# Patient Record
Sex: Male | Born: 1965
Health system: Southern US, Community
[De-identification: ages and names within clinical notes are randomized; demographics above are authoritative.]

## PROBLEM LIST (undated history)

## (undated) DIAGNOSIS — T7840XA Allergy, unspecified, initial encounter: Secondary | ICD-10-CM

## (undated) DIAGNOSIS — F419 Anxiety disorder, unspecified: Secondary | ICD-10-CM

## (undated) DIAGNOSIS — S52501A Unspecified fracture of the lower end of right radius, initial encounter for closed fracture: Secondary | ICD-10-CM

## (undated) DIAGNOSIS — H269 Unspecified cataract: Secondary | ICD-10-CM

## (undated) DIAGNOSIS — E785 Hyperlipidemia, unspecified: Secondary | ICD-10-CM

## (undated) DIAGNOSIS — J309 Allergic rhinitis, unspecified: Secondary | ICD-10-CM

## (undated) DIAGNOSIS — M1990 Unspecified osteoarthritis, unspecified site: Secondary | ICD-10-CM

## (undated) DIAGNOSIS — J45909 Unspecified asthma, uncomplicated: Secondary | ICD-10-CM

## (undated) HISTORY — DX: Unspecified cataract: H26.9

## (undated) HISTORY — DX: Allergic rhinitis, unspecified: J30.9

## (undated) HISTORY — DX: Unspecified asthma, uncomplicated: J45.909

## (undated) HISTORY — DX: Hyperlipidemia, unspecified: E78.5

## (undated) HISTORY — PX: FRACTURE SURGERY: SHX138

## (undated) HISTORY — DX: Allergy, unspecified, initial encounter: T78.40XA

## (undated) HISTORY — DX: Unspecified osteoarthritis, unspecified site: M19.90

## (undated) HISTORY — DX: Anxiety disorder, unspecified: F41.9

## (undated) HISTORY — PX: TONSILLECTOMY AND ADENOIDECTOMY: SHX28

## (undated) HISTORY — PX: GANGLION CYST EXCISION: SHX1691

---

## 1999-09-08 ENCOUNTER — Ambulatory Visit (HOSPITAL_BASED_OUTPATIENT_CLINIC_OR_DEPARTMENT_OTHER): Admission: RE | Admit: 1999-09-08 | Discharge: 1999-09-08 | Payer: Self-pay | Admitting: Orthopedic Surgery

## 2011-09-06 DIAGNOSIS — Z0000 Encounter for general adult medical examination without abnormal findings: Secondary | ICD-10-CM | POA: Insufficient documentation

## 2012-03-17 ENCOUNTER — Encounter: Payer: Self-pay | Admitting: Family Medicine

## 2012-04-28 ENCOUNTER — Ambulatory Visit (INDEPENDENT_AMBULATORY_CARE_PROVIDER_SITE_OTHER): Payer: BC Managed Care – PPO | Admitting: Family Medicine

## 2012-04-28 ENCOUNTER — Encounter: Payer: Self-pay | Admitting: Family Medicine

## 2012-04-28 VITALS — BP 126/72 | HR 76 | Temp 98.4°F | Resp 16 | Ht 72.5 in | Wt 189.4 lb

## 2012-04-28 DIAGNOSIS — J309 Allergic rhinitis, unspecified: Secondary | ICD-10-CM

## 2012-04-28 DIAGNOSIS — J301 Allergic rhinitis due to pollen: Secondary | ICD-10-CM

## 2012-04-28 DIAGNOSIS — R319 Hematuria, unspecified: Secondary | ICD-10-CM

## 2012-04-28 DIAGNOSIS — Z23 Encounter for immunization: Secondary | ICD-10-CM

## 2012-04-28 DIAGNOSIS — M79673 Pain in unspecified foot: Secondary | ICD-10-CM

## 2012-04-28 DIAGNOSIS — Z0000 Encounter for general adult medical examination without abnormal findings: Secondary | ICD-10-CM

## 2012-04-28 DIAGNOSIS — Z7251 High risk heterosexual behavior: Secondary | ICD-10-CM

## 2012-04-28 DIAGNOSIS — M79609 Pain in unspecified limb: Secondary | ICD-10-CM

## 2012-04-28 HISTORY — DX: Allergic rhinitis, unspecified: J30.9

## 2012-04-28 LAB — POCT URINALYSIS DIPSTICK
Bilirubin, UA: NEGATIVE
Blood, UA: NEGATIVE
Ketones, UA: NEGATIVE
Protein, UA: NEGATIVE
pH, UA: 7

## 2012-04-28 LAB — POCT UA - MICROSCOPIC ONLY
Crystals, Ur, HPF, POC: NEGATIVE
Mucus, UA: NEGATIVE
WBC, Ur, HPF, POC: NEGATIVE
Yeast, UA: NEGATIVE

## 2012-04-28 MED ORDER — SILDENAFIL CITRATE 100 MG PO TABS
50.0000 mg | ORAL_TABLET | Freq: Every day | ORAL | Status: DC | PRN
Start: 2012-04-28 — End: 2012-09-28

## 2012-04-28 MED ORDER — BUDESONIDE 32 MCG/ACT NA SUSP: 1.0000 | Freq: Every day | NASAL | Status: DC

## 2012-04-28 NOTE — Progress Notes (Signed)
Subjective:    Patient ID: Kyle Swanson, male    DOB: [birth year], 47 y.o.   MRN: [id number]  HPI Kyle Swanson is a 47 y.o. male Here for annual exam Last tetanus: td in 10/05.  Flu vaccine: given today.   No new medical problems. Feeling ok overall. Less exercise past year - no regular exercise.  Etoh: 3-5 beers,  3-4 times per week, but not everyday, and not every week.  No hx of personal, family or work problems or problems or addiction, no DUI.   Few weeks of nasal congestion - helps with allegra.  Prior on nasal spray. Would like to try rhinocort again if not helping with antihistamine.    Rare soreness in base of feet when waking up only - resolves with walking.   No recent dark or tarry stools, no rectal bleeding.   Paper chart reviewed, last CPE 02/05/11. Trace microscopic hematuria, but normal PSA. Reported dark stools episodically, but negative hemosure. Vitamin D, lipids, PSA, CMP, CBC WNL then. EKG WNL 2010.   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 4-5 new partners since last HIV test. Condoms sometimes. Amenable to sti testing.   Prior trouble maintaining erection - treated with Levitra.  No se's.  Occasional difficulty with this. No recent testosterone level.    No family history on file. Past Medical History  Diagnosis Date  . Allergy   . Asthma    History   Social History  . Marital Status: Single    Spouse Name: N/A    Number of Children: N/A  . Years of Education: N/A   Occupational History  . Not on file.   Social History Main Topics  . Smoking status: Never Smoker   . Smokeless tobacco: Not on file  . Alcohol Use: Yes     Comment: BEER  . Drug Use: No  . Sexually Active: Not on file   Other Topics Concern  . Not on file   Social History Narrative  . No narrative on file    Review of Systems  All other systems reviewed and are negative.   CMA note reviewed.     Objective:   Physical Exam  Vitals reviewed. Constitutional: He is oriented to  person, place, and time. He appears well-developed and well-nourished.  HENT:  Head: Normocephalic and atraumatic.  Right Ear: External ear normal.  Left Ear: External ear normal.  Mouth/Throat: Oropharynx is clear and moist.  Eyes: Conjunctivae normal and EOM are normal. Pupils are equal, round, and reactive to light.  Neck: Normal range of motion. Neck supple. No thyromegaly present.  Cardiovascular: Normal rate, regular rhythm, normal heart sounds and intact distal pulses.   Pulmonary/Chest: Effort normal and breath sounds normal. No respiratory distress. He has no wheezes.  Abdominal: Soft. He exhibits no distension. There is no tenderness. Hernia confirmed negative in the right inguinal area and confirmed negative in the left inguinal area.  Musculoskeletal: Normal range of motion. He exhibits no edema and no tenderness.  Lymphadenopathy:    He has no cervical adenopathy.  Neurological: He is alert and oriented to person, place, and time. He has normal reflexes.  Skin: Skin is warm and dry.  Psychiatric: He has a normal mood and affect. His behavior is normal.   Weight increased 10 pounds form 02/05/11 ov.    Beck depression scale 4.  Results for orders placed in visit on 04/28/12  POCT UA - MICROSCOPIC ONLY      Component Value Range  WBC, Ur, HPF, POC neg     RBC, urine, microscopic 0-1     Bacteria, U Microscopic neg     Mucus, UA neg     Epithelial cells, urine per micros 0-1     Crystals, Ur, HPF, POC neg     Casts, Ur, LPF, POC neg     Yeast, UA neg    POCT URINALYSIS DIPSTICK      Component Value Range   Color, UA yellow     Clarity, UA clear     Glucose, UA neg     Bilirubin, UA neg     Ketones, UA neg     Spec Grav, UA 1.020     Blood, UA neg     pH, UA 7.0     Protein, UA neg     Urobilinogen, UA 0.2     Nitrite, UA neg     Leukocytes, UA Negative         Assessment & Plan:  Kyle Swanson is a 47 y.o. male 1. Problems related to high-risk sexual  behavior  Hepatitis B surface antibody, Hepatitis B surface antigen, Hepatitis C antibody, HSV(herpes simplex vrs) 1+2 ab-IgG, HIV antibody, RPR, GC/chlamydia probe amp, urine, Testosterone, sildenafil (VIAGRA) 100 MG tablet  2. Routine general medical examination at a health care facility  CBC with Differential, PSA, Comprehensive metabolic panel, Lipid panel, TSH, POCT UA - Microscopic Only, POCT urinalysis dipstick  3. Hematuria  POCT UA - Microscopic Only. Check PSA - if elevated, or urinary sx's - rtc for recheck.   4. Need for prophylactic vaccination and inoculation against influenza  Flu vaccine greater than or equal to 3yo preservative free IM given.   5. Allergic rhinitis  budesonide (RHINOCORT AQUA) 32 MCG/ACT nasal spray if not improved with allegra or zyrtec otc.   6. Foot pain  Possible PF based on hx.  Discussed stretches, recheck in next 6 weeks if not improving.   7. Need for Tdap vaccination  Tdap vaccine greater than or equal to 7yo IM given.    PE - anticipatory guidance. Labs as above.  Flu vaccine given today. Tdap given.  Patient Instructions  Your should receive a call or letter about your lab results within the next week to 10 days.   Stretches for foot pains - if this persists - recheck in office to discuss. No blood seen on the initial urine test, possible rare red blood cell on the microscopic test - will check prostate test as discussed - if any new urinary symptoms - return for discussion/workup. Can take 1/2 of viagra as needed - recheck if not improving.  Allegra or zyrtec of allergies, rhinocort if needed, recheck if not improving.  Return to the clinic or go to the nearest emergency room if any of your symptoms worsen or new symptoms occur.   Keeping you healthy  Get these tests  Blood pressure- Have your blood pressure checked once a year by your healthcare provider.  Normal blood pressure is 120/80.  Weight- Have your body mass index (BMI) calculated to  screen for obesity.  BMI is a measure of body fat based on height and weight. You can also calculate your own BMI at https://www.west-esparza.com/.  Cholesterol- Have your cholesterol checked regularly starting at age 31, sooner may be necessary if you have diabetes, high blood pressure, if a family member developed heart diseases at an early age or if you smoke.   Chlamydia, HIV, and other sexual transmitted  disease- Get screened each year until the age of 70 then within three months of each new sexual partner.  Diabetes- Have your blood sugar checked regularly if you have high blood pressure, high cholesterol, a family history of diabetes or if you are overweight.  Get these vaccines  Flu shot- Every fall.  Tetanus shot- Every 10 years.  Menactra- Single dose; prevents meningitis.  Take these steps  Don't smoke- If you do smoke, ask your healthcare provider about quitting. For tips on how to quit, go to www.smokefree.gov or call 1-800-QUIT-NOW.  Be physically active- Exercise 5 days a week for at least 30 minutes.  If you are not already physically active start slow and gradually work up to 30 minutes of moderate physical activity.  Examples of moderate activity include walking briskly, mowing the yard, dancing, swimming bicycling, etc.  Eat a healthy diet- Eat a variety of healthy foods such as fruits, vegetables, low fat milk, low fat cheese, yogurt, lean meats, poultry, fish, beans, tofu, etc.  For more information on healthy eating, go to www.thenutritionsource.org  Drink alcohol in moderation- Limit alcohol intake two drinks or less a day.  Never drink and drive.  Dentist- Brush and floss teeth twice daily; visit your dentis twice a year.  Depression-Your emotional health is as important as your physical health.  If you're feeling down, losing interest in things you normally enjoy please talk with your healthcare provider.  Gun Safety- If you keep a gun in your home, keep it unloaded  and with the safety lock on.  Bullets should be stored separately.  Helmet use- Always wear a helmet when riding a motorcycle, bicycle, rollerblading or skateboarding.  Safe sex- If you may be exposed to a sexually transmitted infection, use a condom  Seat belts- Seat bels can save your life; always wear one.  Smoke/Carbon Monoxide detectors- These detectors need to be installed on the appropriate level of your home.  Replace batteries at least once a year.  Skin Cancer- When out in the sun, cover up and use sunscreen SPF 15 or higher.  Violence- If anyone is threatening or hurting you, please tell your healthcare provider.

## 2012-04-28 NOTE — Progress Notes (Signed)
  Subjective:    Patient ID: Kyle Swanson, male    DOB: 05-Oct-1965, 47 y.o.   MRN: [id number]  HPI    Review of Systems  Constitutional: Negative.   HENT: Positive for congestion, sneezing, neck pain and sinus pressure.   Eyes: Negative.   Respiratory: Negative.   Cardiovascular: Negative.   Gastrointestinal: Negative.   Genitourinary: Negative.   Skin: Negative.   Neurological: Negative.   Hematological: Negative.   Psychiatric/Behavioral: Negative.        Objective:   Physical Exam        Assessment & Plan:

## 2012-04-28 NOTE — Patient Instructions (Addendum)
Your should receive a call or letter about your lab results within the next week to 10 days.   Stretches for foot pains - if this persists - recheck in office to discuss. No blood seen on the initial urine test, possible rare red blood cell on the microscopic test - will check prostate test as discussed - if any new urinary symptoms - return for discussion/workup. Can take 1/2 of viagra as needed - recheck if not improving.  Allegra or zyrtec of allergies, rhinocort if needed, recheck if not improving.  Return to the clinic or go to the nearest emergency room if any of your symptoms worsen or new symptoms occur.   Keeping you healthy  Get these tests  Blood pressure- Have your blood pressure checked once a year by your healthcare provider.  Normal blood pressure is 120/80.  Weight- Have your body mass index (BMI) calculated to screen for obesity.  BMI is a measure of body fat based on height and weight. You can also calculate your own BMI at https://www.west-esparza.com/.  Cholesterol- Have your cholesterol checked regularly starting at age 52, sooner may be necessary if you have diabetes, high blood pressure, if a family member developed heart diseases at an early age or if you smoke.   Chlamydia, HIV, and other sexual transmitted disease- Get screened each year until the age of 62 then within three months of each new sexual partner.  Diabetes- Have your blood sugar checked regularly if you have high blood pressure, high cholesterol, a family history of diabetes or if you are overweight.  Get these vaccines  Flu shot- Every fall.  Tetanus shot- Every 10 years.  Menactra- Single dose; prevents meningitis.  Take these steps  Don't smoke- If you do smoke, ask your healthcare provider about quitting. For tips on how to quit, go to www.smokefree.gov or call 1-800-QUIT-NOW.  Be physically active- Exercise 5 days a week for at least 30 minutes.  If you are not already physically active start  slow and gradually work up to 30 minutes of moderate physical activity.  Examples of moderate activity include walking briskly, mowing the yard, dancing, swimming bicycling, etc.  Eat a healthy diet- Eat a variety of healthy foods such as fruits, vegetables, low fat milk, low fat cheese, yogurt, lean meats, poultry, fish, beans, tofu, etc.  For more information on healthy eating, go to www.thenutritionsource.org  Drink alcohol in moderation- Limit alcohol intake two drinks or less a day.  Never drink and drive.  Dentist- Brush and floss teeth twice daily; visit your dentis twice a year.  Depression-Your emotional health is as important as your physical health.  If you're feeling down, losing interest in things you normally enjoy please talk with your healthcare provider.  Gun Safety- If you keep a gun in your home, keep it unloaded and with the safety lock on.  Bullets should be stored separately.  Helmet use- Always wear a helmet when riding a motorcycle, bicycle, rollerblading or skateboarding.  Safe sex- If you may be exposed to a sexually transmitted infection, use a condom  Seat belts- Seat bels can save your life; always wear one.  Smoke/Carbon Monoxide detectors- These detectors need to be installed on the appropriate level of your home.  Replace batteries at least once a year.  Skin Cancer- When out in the sun, cover up and use sunscreen SPF 15 or higher.  Violence- If anyone is threatening or hurting you, please tell your healthcare provider.

## 2012-04-29 ENCOUNTER — Encounter: Payer: Self-pay | Admitting: *Deleted

## 2012-04-29 LAB — HEPATITIS C ANTIBODY: HCV Ab: NEGATIVE

## 2012-04-29 LAB — CBC WITH DIFFERENTIAL/PLATELET
Basophils Absolute: 0 10*3/uL (ref 0.0–0.1)
Eosinophils Relative: 7 % — ABNORMAL HIGH (ref 0–5)
Lymphocytes Relative: 27 % (ref 12–46)
Lymphs Abs: 1.5 10*3/uL (ref 0.7–4.0)
MCV: 87 fL (ref 78.0–100.0)
Neutro Abs: 3.3 10*3/uL (ref 1.7–7.7)
Neutrophils Relative %: 56 % (ref 43–77)
Platelets: 219 10*3/uL (ref 150–400)
RBC: 5.24 MIL/uL (ref 4.22–5.81)
RDW: 13.7 % (ref 11.5–15.5)
WBC: 5.7 10*3/uL (ref 4.0–10.5)

## 2012-04-29 LAB — HSV(HERPES SIMPLEX VRS) I + II AB-IGG
HSV 1 Glycoprotein G Ab, IgG: <0.1 IV
HSV 2 Glycoprotein G Ab, IgG: 0.25 IV

## 2012-04-29 LAB — COMPREHENSIVE METABOLIC PANEL
ALT: 35 U/L (ref 0–53)
AST: 22 U/L (ref 0–37)
Albumin: 4.3 g/dL (ref 3.5–5.2)
Alkaline Phosphatase: 55 U/L (ref 39–117)
BUN: 21 mg/dL (ref 6–23)
Calcium: 9.1 mg/dL (ref 8.4–10.5)
Chloride: 106 mEq/L (ref 96–112)
Potassium: 3.8 mEq/L (ref 3.5–5.3)
Sodium: 141 mEq/L (ref 135–145)
Total Protein: 6.5 g/dL (ref 6.0–8.3)

## 2012-04-29 LAB — HEPATITIS B SURFACE ANTIBODY, QUANTITATIVE: Hepatitis B-Post: >1000 m[IU]/mL

## 2012-04-29 LAB — LIPID PANEL
HDL: 60 mg/dL (ref >39)
LDL Cholesterol: 94 mg/dL (ref 0–99)

## 2012-04-29 LAB — TESTOSTERONE: Testosterone: 432 ng/dL (ref 300–890)

## 2012-04-29 LAB — TSH: TSH: 1.972 u[IU]/mL (ref 0.350–4.500)

## 2012-04-29 LAB — PSA: PSA: 1.11 ng/mL (ref <=4.00)

## 2012-05-01 ENCOUNTER — Telehealth: Payer: Self-pay

## 2012-05-01 NOTE — Telephone Encounter (Signed)
Pt CB to get his lab results. I gave him all normal/neg results and mailed a copy to pt.

## 2012-09-28 ENCOUNTER — Encounter (HOSPITAL_COMMUNITY): Payer: Self-pay | Admitting: Emergency Medicine

## 2012-09-28 ENCOUNTER — Emergency Department (HOSPITAL_COMMUNITY): Payer: BC Managed Care – PPO

## 2012-09-28 ENCOUNTER — Emergency Department (HOSPITAL_COMMUNITY)
Admission: EM | Admit: 2012-09-28 | Discharge: 2012-09-28 | Disposition: A | Discharge status: Home / Self Care | Payer: BC Managed Care – PPO | Attending: Emergency Medicine | Admitting: Emergency Medicine

## 2012-09-28 DIAGNOSIS — Z79899 Other long term (current) drug therapy: Secondary | ICD-10-CM | POA: Insufficient documentation

## 2012-09-28 DIAGNOSIS — J45909 Unspecified asthma, uncomplicated: Secondary | ICD-10-CM | POA: Insufficient documentation

## 2012-09-28 DIAGNOSIS — R002 Palpitations: Secondary | ICD-10-CM | POA: Insufficient documentation

## 2012-09-28 LAB — CBC
HCT: 44.7 % (ref 39.0–52.0)
Hemoglobin: 15.7 g/dL (ref 13.0–17.0)
MCH: 31.5 pg (ref 26.0–34.0)
MCHC: 35.1 g/dL (ref 30.0–36.0)
RBC: 4.98 MIL/uL (ref 4.22–5.81)

## 2012-09-28 LAB — URINALYSIS, ROUTINE W REFLEX MICROSCOPIC
Glucose, UA: NEGATIVE mg/dL
Hgb urine dipstick: NEGATIVE
Ketones, ur: NEGATIVE mg/dL
Leukocytes, UA: NEGATIVE
pH: 7 (ref 5.0–8.0)

## 2012-09-28 LAB — POCT I-STAT, CHEM 8
BUN: 18 mg/dL (ref 6–23)
Chloride: 102 mEq/L (ref 96–112)
Creatinine, Ser: 1.2 mg/dL (ref 0.50–1.35)
Sodium: 139 mEq/L (ref 135–145)

## 2012-09-28 LAB — POCT I-STAT TROPONIN I: Troponin i, poc: 0 ng/mL (ref 0.00–0.08)

## 2012-09-28 NOTE — ED Notes (Addendum)
PT. IS IN THE RESTROOM .

## 2012-09-28 NOTE — ED Provider Notes (Signed)
History    CSN: [id number] Arrival date & time 09/28/12  0113  First MD Initiated Contact with Patient 09/28/12 0130     Chief Complaint  Patient presents with  . Palpitations   (Consider location/radiation/quality/duration/timing/severity/associated sxs/prior Treatment) HPI Comments: 47 year old male with no past medical history who takes no prescription medications and presents with several days of feeling of palpitations. He states that approximately one week ago he was taking and over-the-counter cough medication which he thinks was guaifenesin and has been taking this intermittently until yesterday when he stopped. He now feels like he has occasional palpitations, he feels like this is a vibrating sensation around his chest on the left, last one to 2 seconds and then resolves. This may happen multiple times a minute, sometimes it goes for several hours without this feeling. He denies any associated fevers, chills, nausea, vomiting, chest pain, shortness of breath, swelling in the legs. He has had very little energy all week long he is currently asymptomatic. Nothing seems to make the symptoms come on. He denies any significant alcohol consumption drinking occasional beer. He denies any tobacco use, no significant caffeine use other than 3 diet sodas a day which is no change from his prior use.  Patient is a 47 y.o. male presenting with palpitations. The history is provided by the patient and the spouse.  Palpitations  Past Medical History  Diagnosis Date  . Allergy   . Asthma    Past Surgical History  Procedure Laterality Date  . Tonsillectomy and adenoidectomy      childhood  . Ganglion cyst excision      left wrist 10 years ago   Family History  Problem Relation Age of Onset  . COPD Father     emphysema  . Cancer Maternal Grandmother     skin  . Cancer Paternal Grandmother   . Diabetes Paternal Grandfather    History  Substance Use Topics  . Smoking status: Never  Smoker   . Smokeless tobacco: Not on file  . Alcohol Use: Yes     Comment: BEER    Review of Systems  Cardiovascular: Positive for palpitations.  All other systems reviewed and are negative.    Allergies  Sulfa antibiotics  Home Medications   Current Outpatient Rx  Name  Route  Sig  Dispense  Refill  . b complex vitamins tablet   Oral   Take 1 tablet by mouth daily.         . budesonide (RHINOCORT AQUA) 32 MCG/ACT nasal spray   Nasal   Place 1 spray into the nose daily.   1 Bottle   3   . Fexofenadine HCl (ALLEGRA PO)   Oral   Take by mouth as needed.         . sildenafil (VIAGRA) 100 MG tablet   Oral   Take 0.5 tablets (50 mg total) by mouth daily as needed for erectile dysfunction.   6 tablet   0   . VITAMIN D, CHOLECALCIFEROL, PO   Oral   Take by mouth daily.          BP 175/98  Pulse 81  Temp(Src) 98 F (36.7 C) (Oral)  Resp 18  SpO2 99% Physical Exam  Nursing note and vitals reviewed. Constitutional: He appears well-developed and well-nourished. No distress.  HENT:  Head: Normocephalic and atraumatic.  Mouth/Throat: Oropharynx is clear and moist. No oropharyngeal exudate.  Eyes: Conjunctivae and EOM are normal. Pupils are equal, round, and reactive  to light. Right eye exhibits no discharge. Left eye exhibits no discharge. No scleral icterus.  Neck: Normal range of motion. Neck supple. No JVD present. No thyromegaly present.  Cardiovascular: Normal rate, regular rhythm, normal heart sounds and intact distal pulses.  Exam reveals no gallop and no friction rub.   No murmur heard. Pulmonary/Chest: Effort normal and breath sounds normal. No respiratory distress. He has no wheezes. He has no rales.  Abdominal: Soft. Bowel sounds are normal. He exhibits no distension and no mass. There is no tenderness.  Musculoskeletal: Normal range of motion. He exhibits no edema and no tenderness.  Lymphadenopathy:    He has no cervical adenopathy.   Neurological: He is alert. Coordination normal.  Skin: Skin is warm and dry. No rash noted. No erythema.  Psychiatric: He has a normal mood and affect. His behavior is normal.    ED Course  Procedures (including critical care time) Labs Reviewed  URINALYSIS, ROUTINE W REFLEX MICROSCOPIC - Abnormal; Notable for the following:    Color, Urine STRAW (*)    All other components within normal limits  CBC  TSH  POCT I-STAT, CHEM 8  POCT I-STAT TROPONIN I   Dg Chest Port 1 View  09/28/2012   *RADIOLOGY REPORT*  Clinical Data: Chest pain, heart flutter, nausea, right hand numbness, history asthma  PORTABLE CHEST - 1 VIEW  Comparison: Portable exam 0156 hours without priors for comparison  Findings: Normal heart size, mediastinal contours, and pulmonary vascularity. Lungs clear. No pleural effusion or pneumothorax. Bones unremarkable.  IMPRESSION: No acute abnormalities.   Original Report Authenticated By: Ulyses Southward, M.D.   1. Palpitations     MDM  The patient's exam is very normal, his EKG is very normal, he has been using an over-the-counter medication which may account for these intermittent palpitations however when I examined the patient and he tells me that he is having a palpitation there is no corresponding arrhythmia or ectopic beats on the monitor. We'll check his electrolytes and a urinalysis as he also notes that he is been having urinary frequency. The patient is hemodynamically stable and has mild hypertension on his initial set of vital signs.  ED ECG REPORT  I personally interpreted this EKG   Date: 09/28/2012   Rate: 83  Rhythm: normal sinus rhythm  QRS Axis: normal  Intervals: normal  ST/T Wave abnormalities: normal  Conduction Disutrbances:none  Narrative Interpretation:   Old EKG Reviewed: none available  Labs are all normal - chest xray clear - pt stable for d/c.  Vida Roller, MD 09/28/12 [phone]

## 2012-09-28 NOTE — ED Notes (Signed)
PT. REPORTS PALPITATIONS " FEELS LIKE IT'S VIBRATING" ONSET YESTERDAY , DENIES CHEST PAIN OR SOB , SLIGHT NAUSEA / NO DIAPHORESSIS.

## 2012-09-30 ENCOUNTER — Telehealth (HOSPITAL_COMMUNITY): Payer: Self-pay | Admitting: Emergency Medicine

## 2012-09-30 NOTE — ED Notes (Signed)
Pt calling for lab results(unable to get into My Chart).  ID verified x 2.  Informed TSH WNL.

## 2012-10-06 ENCOUNTER — Ambulatory Visit (INDEPENDENT_AMBULATORY_CARE_PROVIDER_SITE_OTHER): Payer: BC Managed Care – PPO | Admitting: Family Medicine

## 2012-10-06 ENCOUNTER — Encounter: Payer: Self-pay | Admitting: Family Medicine

## 2012-10-06 VITALS — BP 123/72 | HR 79 | Temp 98.6°F | Resp 16 | Ht 73.0 in | Wt 188.2 lb

## 2012-10-06 DIAGNOSIS — F4320 Adjustment disorder, unspecified: Secondary | ICD-10-CM

## 2012-10-06 DIAGNOSIS — R002 Palpitations: Secondary | ICD-10-CM

## 2012-10-06 DIAGNOSIS — R351 Nocturia: Secondary | ICD-10-CM

## 2012-10-06 DIAGNOSIS — R350 Frequency of micturition: Secondary | ICD-10-CM

## 2012-10-06 LAB — POCT URINALYSIS DIPSTICK
Bilirubin, UA: NEGATIVE
Glucose, UA: NEGATIVE
Leukocytes, UA: NEGATIVE
Nitrite, UA: NEGATIVE
Urobilinogen, UA: 0.2

## 2012-10-06 LAB — POCT UA - MICROSCOPIC ONLY
Crystals, Ur, HPF, POC: NEGATIVE
WBC, Ur, HPF, POC: NEGATIVE

## 2012-10-06 MED ORDER — ALPRAZOLAM 0.25 MG PO TABS: 0.2500 mg | ORAL_TABLET | Freq: Two times a day (BID) | ORAL | Status: DC | PRN

## 2012-10-06 NOTE — Progress Notes (Signed)
Subjective:    Patient ID: Kyle Swanson, male    DOB: 1965-11-25, 47 y.o.   MRN: [id number]  HPI Kyle Swanson is a 47 y.o. male Seen in ER 09/28/12 with palpitations, fatigue. was taking otc cold med prior., 3 diet sodas per day by hx in ER.  Had EKG, CXR and labs including lytes and TSH that were reassuring and noted EDP note that patient had palpitiation while he was there and no ectopy on monitor.   Here for follow up.   Feels "mini vibrations in chest" - started on 09/27/12.  Last 1-2 seconds, sometimes up to 5-6 in a row.  Had increased in frequency by the time he went to the ER, and trouble getting to sleep,and nocturia- 2-3 times per night. (U/a was normal in ER).  Also had numbness in R hand that night - noticed occasionally in the past depending on how he sleeps.   Had been doing better past few days, no palpitation sx's - then 2 or 3 today. No chest pain/heaviness.   Less sodas - now 1-2/day. Less alcohol - 3 beers in past week.  No dysuria, but persistent nocturia - 2-3 times per night.  If anxious or stressed during day - will feel need to urinate. NO personal or FH of MI/CAD.   Stressors: brother moving to [place], short staffed at work, going through [employer]. Unsure if will have a job.  Does not feel depressed, but stressed with [employer].   CXR report in ER: *RADIOLOGY REPORT*  Clinical Data: Chest pain, heart flutter, nausea, right hand  numbness, history asthma  PORTABLE CHEST - 1 VIEW  Comparison: Portable exam 0156 hours without priors for comparison  Findings:  Normal heart size, mediastinal contours, and pulmonary vascularity.  Lungs clear.  No pleural effusion or pneumothorax.  Bones unremarkable.  IMPRESSION:  No acute abnormalities.    Results for orders placed during the hospital encounter of 09/28/12  TSH      Result Value Range   TSH 2.730  0.350 - 4.500 uIU/mL  CBC      Result Value Range   WBC 6.7  4.0 - 10.5 K/uL   RBC 4.98  4.22 - 5.81 MIL/uL   Hemoglobin 15.7  13.0 - 17.0 g/dL   HCT 40.9  81.1 - 91.4 %   MCV 89.8  78.0 - 100.0 fL   MCH 31.5  26.0 - 34.0 pg   MCHC 35.1  30.0 - 36.0 g/dL   RDW 78.2  95.6 - 21.3 %   Platelets 198  150 - 400 K/uL  URINALYSIS, ROUTINE W REFLEX MICROSCOPIC      Result Value Range   Color, Urine STRAW (*) YELLOW   APPearance CLEAR  CLEAR   Specific Gravity, Urine 1.005  1.005 - 1.030   pH 7.0  5.0 - 8.0   Glucose, UA NEGATIVE  NEGATIVE mg/dL   Hgb urine dipstick NEGATIVE  NEGATIVE   Bilirubin Urine NEGATIVE  NEGATIVE   Ketones, ur NEGATIVE  NEGATIVE mg/dL   Protein, ur NEGATIVE  NEGATIVE mg/dL   Urobilinogen, UA 0.2  0.0 - 1.0 mg/dL   Nitrite NEGATIVE  NEGATIVE   Leukocytes, UA NEGATIVE  NEGATIVE  POCT I-STAT, CHEM 8      Result Value Range   Sodium 139  135 - 145 mEq/L   Potassium 4.3  3.5 - 5.1 mEq/L   Chloride 102  96 - 112 mEq/L   BUN 18  6 - 23 mg/dL  Creatinine, Ser 1.20  0.50 - 1.35 mg/dL   Glucose, Bld 95  70 - 99 mg/dL   Calcium, Ion 9.60  4.54 - 1.23 mmol/L   TCO2 28  0 - 100 mmol/L   Hemoglobin 15.6  13.0 - 17.0 g/dL   HCT 09.8  11.9 - 14.7 %  POCT I-STAT TROPONIN I      Result Value Range   Troponin i, poc 0.00  0.00 - 0.08 ng/mL   Comment 3              Review of Systems  Constitutional: Positive for fatigue (prior - but improved now. ). Negative for fever, chills and unexpected weight change.  Eyes: Negative for visual disturbance.  Respiratory: Negative for cough, chest tightness and shortness of breath.   Cardiovascular: Negative for chest pain, palpitations and leg swelling.  Gastrointestinal: Negative for abdominal pain and blood in stool.  Genitourinary: Negative for dysuria, urgency, frequency, hematuria, decreased urine volume, discharge, penile swelling, scrotal swelling, difficulty urinating (2-3 times per night, but usually ok during the day. ) and testicular pain.  Neurological: Negative for dizziness, light-headedness and headaches.       Objective:    Physical Exam  Vitals reviewed. Constitutional: He is oriented to person, place, and time. He appears well-developed and well-nourished.  HENT:  Head: Normocephalic and atraumatic.  Eyes: EOM are normal. Pupils are equal, round, and reactive to light.  Neck: No JVD present. Carotid bruit is not present.  Cardiovascular: Normal rate, regular rhythm and normal heart sounds.   No murmur heard. Pulmonary/Chest: Effort normal and breath sounds normal. He has no rales.  Abdominal: Soft. Bowel sounds are normal. There is no tenderness.  Genitourinary: Prostate normal. Prostate is not tender.  Musculoskeletal: He exhibits no edema.  Neurological: He is alert and oriented to person, place, and time.  Skin: Skin is warm and dry.  Psychiatric: He has a normal mood and affect. His behavior is normal. Thought content normal.   EKG: sr, no acute findings, no ectopy.   Results for orders placed in visit on 10/06/12  POCT URINALYSIS DIPSTICK      Result Value Range   Color, UA yellow     Clarity, UA clear     Glucose, UA neg     Bilirubin, UA neg     Ketones, UA neg     Spec Grav, UA 1.015     Blood, UA trace     pH, UA 7.5     Protein, UA neg     Urobilinogen, UA 0.2     Nitrite, UA neg     Leukocytes, UA Negative    POCT UA - MICROSCOPIC ONLY      Result Value Range   WBC, Ur, HPF, POC neg     RBC, urine, microscopic 0-3     Bacteria, U Microscopic neg     Mucus, UA neg     Epithelial cells, urine per micros 0-1     Crystals, Ur, HPF, POC neg     Casts, Ur, LPF, POC neg     Yeast, UA neg         Assessment & Plan:  Kyle Swanson is a 47 y.o. male Palpitations - Plan: EKG 12-Lead, POCT urinalysis dipstick, ALPRAZolam (XANAX) 0.25 MG tablet  Nocturia - Plan: POCT UA - Microscopic Only, PSA  Urinary frequency - Plan: POCT UA - Microscopic Only, PSA  Adjustment disorder - Plan: ALPRAZolam (XANAX) 0.25 MG tablet  Palpitations - reassuring ER workup, EKG, and labs. Low risk for  CAD with only RF age 24.  Suspect stress/anxiety/adjustment component with recent stressors and possible caffeine component. Trial of xanax only if needed with stressful times, but also relaxation techniques/stress mgt techniques.  If not improving this week , consider cards eval for possible holter monitoring.  Continue to limit caffeine intake.   Urinary frequency - check PSA, limit late night fluids. Few rbc on U/a, but doubt infection. rechcek u/a at next ov in few weeks. Sooner if worse.   Meds ordered this encounter  Medications  . ALPRAZolam (XANAX) 0.25 MG tablet    Sig: Take 1 tablet (0.25 mg total) by mouth 2 (two) times daily as needed for anxiety.    Dispense:  20 tablet    Refill:  0     Patient Instructions  Your labs look ok from the ER and today. Will check prostate test, but can try to decrease fluid intake within 2 hours of bedtime.  Stay hydrated the rest of the day. If palpitations/chest feeling is not improving with relaxation techniques and xanax as needed - let me know and I will refer you to cardiology.  Return to the clinic or go to the nearest emergency room if any of your symptoms worsen or new symptoms occur.  If urinary symptoms not improving in next 1-2 weeks - recheck to discuss further.

## 2012-10-06 NOTE — Patient Instructions (Addendum)
Your labs look ok from the ER and today. Will check prostate test, but can try to decrease fluid intake within 2 hours of bedtime.  Stay hydrated the rest of the day. If palpitations/chest feeling is not improving with relaxation techniques and xanax as needed - let me know and I will refer you to cardiology.  Return to the clinic or go to the nearest emergency room if any of your symptoms worsen or new symptoms occur.  If urinary symptoms not improving in next 1-2 weeks - recheck to discuss further.

## 2012-10-07 LAB — PSA: PSA: 1.16 ng/mL (ref <=4.00)

## 2012-10-17 ENCOUNTER — Telehealth: Payer: Self-pay

## 2012-10-17 DIAGNOSIS — R002 Palpitations: Secondary | ICD-10-CM

## 2012-10-17 DIAGNOSIS — R5381 Other malaise: Secondary | ICD-10-CM

## 2012-10-17 NOTE — Telephone Encounter (Signed)
To Dr Neva Seat also, FYI patient called back for referral, it is made for him, per your office visit.

## 2012-10-17 NOTE — Telephone Encounter (Signed)
Spoke to Dr. Neva Seat and patient wants to go ahead and get a referral like they discussed in his last ov with Dr. Neva Seat. Please review.  Referral to Cardiology- location with [employer] preferred  Has available time off on 30 & 31st- July   Best number to get more information: [phone]

## 2012-10-17 NOTE — Telephone Encounter (Signed)
Referral put in, patient wants on July 30 or 31, can this be done, we may need to send to [facility]

## 2012-10-18 NOTE — Telephone Encounter (Signed)
Agree with cards eval as discussed at last ov.  Thanks.

## 2012-12-11 ENCOUNTER — Ambulatory Visit: Payer: BC Managed Care – PPO | Admitting: Cardiovascular Disease

## 2012-12-31 ENCOUNTER — Other Ambulatory Visit: Payer: Self-pay | Admitting: Family Medicine

## 2012-12-31 DIAGNOSIS — F411 Generalized anxiety disorder: Secondary | ICD-10-CM

## 2013-01-02 NOTE — Telephone Encounter (Signed)
Refilled, but have pt follow up prior to this Rx from running out.

## 2013-01-02 NOTE — Telephone Encounter (Signed)
Left message to return call. RX refilled but needs follow up before additional refills.

## 2013-01-03 ENCOUNTER — Encounter: Payer: Self-pay | Admitting: *Deleted

## 2013-01-03 DIAGNOSIS — T7840XA Allergy, unspecified, initial encounter: Secondary | ICD-10-CM | POA: Insufficient documentation

## 2013-01-03 DIAGNOSIS — J45909 Unspecified asthma, uncomplicated: Secondary | ICD-10-CM | POA: Insufficient documentation

## 2013-01-05 ENCOUNTER — Ambulatory Visit (INDEPENDENT_AMBULATORY_CARE_PROVIDER_SITE_OTHER): Payer: BC Managed Care – PPO | Admitting: Cardiovascular Disease

## 2013-01-05 ENCOUNTER — Encounter: Payer: Self-pay | Admitting: Cardiovascular Disease

## 2013-01-05 VITALS — BP 112/60 | HR 88 | Ht 73.0 in | Wt 192.4 lb

## 2013-01-05 DIAGNOSIS — R002 Palpitations: Secondary | ICD-10-CM

## 2013-01-05 NOTE — Progress Notes (Signed)
Patient ID: Kyle Swanson, male   DOB: 08-24-1965, 47 y.o.   MRN: [id number] 47 yo referred by primary for palpitations   Seen in ER 09/28/12 with palpitations, fatigue. was taking otc cold med prior., 3 diet sodas per day by hx in ER. Had EKG, CXR and labs including lytes and TSH that were reassuring Had palpitations while on telemetry and no arrhythmia   Feels "mini vibrations in chest" - started on 09/27/12. Last 1-2 seconds, sometimes up to 5-6 in a row. Had increased in frequency by the time he went to the ER, and trouble getting to sleep,and nocturia- 2-3 times per night. (U/a was normal in ER). Also had numbness in R hand that night - noticed occasionally in the past depending on how he sleeps.  Had been doing better past few days, no palpitation sx's - then 2 or 3 today. No chest pain/heaviness.  Less sodas - now 1-2/day. Less alcohol - 3 beers in past week. No dysuria, but persistent nocturia - 2-3 times per night. If anxious or stressed during day - will feel need to urinate.    Stressors: brother moving to [place], short staffed at work, going through [employer]. Unsure if will have a job. Does not feel depressed, but stressed with [employer].   Doing some better with no palpitations now.  No chest pain ETOH abuse runs on both sides of family especially mothers side He denies daily ETOH  ROS: Denies fever, malais, weight loss, blurry vision, decreased visual acuity, cough, sputum, SOB, hemoptysis, pleuritic pain, palpitaitons, heartburn, abdominal pain, melena, lower extremity edema, claudication, or rash.  All other systems reviewed and negative   General: Affect appropriate Healthy:  appears stated age HEENT: normal Neck supple with no adenopathy JVP normal no bruits no thyromegaly Lungs clear with no wheezing and good diaphragmatic motion Heart:  S1/S2 no murmur,rub, gallop or click PMI normal Abdomen: benighn, BS positve, no tenderness, no AAA no bruit.  No HSM or HJR Distal pulses  intact with no bruits No edema Neuro non-focal Skin warm and dry No muscular weakness  Medications Current Outpatient Prescriptions  Medication Sig Dispense Refill  . ALPRAZolam (XANAX) 0.25 MG tablet take 1 tablet by mouth twice a day if needed for anxiety  20 tablet  0  . OVER THE COUNTER MEDICATION Take 2 tablets by mouth every 4 (four) hours as needed (congestion/cold symptoms). [facility] Brand (Guaifenesin)       No current facility-administered medications for this visit.    Allergies Sulfa antibiotics  Family History: Family History  Problem Relation Age of Onset  . COPD Father     emphysema  . Cancer Maternal Grandmother     skin  . Cancer Paternal Grandmother   . Diabetes Paternal Grandfather     Social History: History   Social History  . Marital Status: Single    Spouse Name: N/A    Number of Children: N/A  . Years of Education: N/A   Occupational History  . Not on file.   Social History Main Topics  . Smoking status: Never Smoker   . Smokeless tobacco: Not on file  . Alcohol Use: Yes     Comment: BEER  . Drug Use: No  . Sexual Activity: Yes    Birth Control/ Protection: Condom, IUD     Comment: number of sex partners in the last 12 months - 4   Other Topics Concern  . Not on file   Social History Narrative  . No  narrative on file    Electrocardiogram:  10/09/12  SR rate 70 normal ECG    Assessment and Plan

## 2013-01-05 NOTE — Assessment & Plan Note (Signed)
Benign No correlation to arrhythmia on monitor in ER Normal exam and ECG  PRN xanax.  Dietary changes and exercise  Improved No need for further testing at this time

## 2013-01-05 NOTE — Patient Instructions (Signed)
Your physician recommends that you schedule a follow-up appointment in: AS NEEDED  Your physician recommends that you continue on your current medications as directed. Please refer to the Current Medication list given to you today.  

## 2013-01-29 ENCOUNTER — Other Ambulatory Visit: Payer: Self-pay

## 2014-11-22 ENCOUNTER — Ambulatory Visit (INDEPENDENT_AMBULATORY_CARE_PROVIDER_SITE_OTHER): Payer: 59 | Admitting: Internal Medicine

## 2014-11-22 ENCOUNTER — Ambulatory Visit (INDEPENDENT_AMBULATORY_CARE_PROVIDER_SITE_OTHER): Payer: 59

## 2014-11-22 VITALS — BP 116/80 | HR 82 | Temp 97.8°F | Resp 16 | Ht 74.0 in | Wt 198.0 lb

## 2014-11-22 DIAGNOSIS — S39012A Strain of muscle, fascia and tendon of lower back, initial encounter: Secondary | ICD-10-CM

## 2014-11-22 DIAGNOSIS — G8929 Other chronic pain: Secondary | ICD-10-CM

## 2014-11-22 DIAGNOSIS — G4489 Other headache syndrome: Secondary | ICD-10-CM | POA: Diagnosis not present

## 2014-11-22 DIAGNOSIS — M545 Low back pain, unspecified: Secondary | ICD-10-CM

## 2014-11-22 DIAGNOSIS — T7840XA Allergy, unspecified, initial encounter: Secondary | ICD-10-CM

## 2014-11-22 LAB — COMPREHENSIVE METABOLIC PANEL
ALK PHOS: 60 U/L (ref 40–115)
ALT: 72 U/L — AB (ref 9–46)
AST: 43 U/L — ABNORMAL HIGH (ref 10–40)
Albumin: 4.7 g/dL (ref 3.6–5.1)
BUN: 16 mg/dL (ref 7–25)
CO2: 30 mmol/L (ref 20–31)
CREATININE: 1.1 mg/dL (ref 0.60–1.35)
Calcium: 9.5 mg/dL (ref 8.6–10.3)
Chloride: 101 mmol/L (ref 98–110)
Glucose, Bld: 89 mg/dL (ref 65–99)
Potassium: 4.4 mmol/L (ref 3.5–5.3)
SODIUM: 142 mmol/L (ref 135–146)
TOTAL PROTEIN: 7.1 g/dL (ref 6.1–8.1)
Total Bilirubin: 0.6 mg/dL (ref 0.2–1.2)

## 2014-11-22 LAB — POCT CBC
Granulocyte percent: 65.8 %G (ref 37–80)
HCT, POC: 45.9 % (ref 43.5–53.7)
Hemoglobin: 15.3 g/dL (ref 14.1–18.1)
LYMPH, POC: 1.8 (ref 0.6–3.4)
MCH: 29.5 pg (ref 27–31.2)
MCHC: 33.4 g/dL (ref 31.8–35.4)
MCV: 88.3 fL (ref 80–97)
MID (CBC): 0.3 (ref 0–0.9)
MPV: 6.6 fL (ref 0–99.8)
PLATELET COUNT, POC: 228 10*3/uL (ref 142–424)
POC Granulocyte: 4.2 (ref 2–6.9)
POC LYMPH %: 28.8 % (ref 10–50)
POC MID %: 5.4 %M (ref 0–12)
RBC: 5.19 M/uL (ref 4.69–6.13)
RDW, POC: 13.6 %
WBC: 6.4 10*3/uL (ref 4.6–10.2)

## 2014-11-22 LAB — POCT URINALYSIS DIPSTICK
Bilirubin, UA: NEGATIVE
GLUCOSE UA: NEGATIVE
KETONES UA: NEGATIVE
LEUKOCYTES UA: NEGATIVE
Nitrite, UA: NEGATIVE
PROTEIN UA: NEGATIVE
Spec Grav, UA: 1.02
UROBILINOGEN UA: 0.2
pH, UA: 6

## 2014-11-22 LAB — POCT UA - MICROSCOPIC ONLY
Casts, Ur, LPF, POC: NEGATIVE
Crystals, Ur, HPF, POC: NEGATIVE
Mucus, UA: POSITIVE
WBC, UR, HPF, POC: NEGATIVE
Yeast, UA: NEGATIVE

## 2014-11-22 LAB — POCT SEDIMENTATION RATE: POCT SED RATE: 10 mm/hr (ref 0–22)

## 2014-11-22 MED ORDER — METHOCARBAMOL 750 MG PO TABS: 750.0000 mg | ORAL_TABLET | Freq: Three times a day (TID) | ORAL | Status: DC | PRN

## 2014-11-22 NOTE — Patient Instructions (Addendum)
Low Back Strain with Rehab A strain is an injury in which a tendon or muscle is torn. The muscles and tendons of the lower back are vulnerable to strains. However, these muscles and tendons are very strong and require a great force to be injured. Strains are classified into three categories. Grade 1 strains cause pain, but the tendon is not lengthened. Grade 2 strains include a lengthened ligament, due to the ligament being stretched or partially ruptured. With grade 2 strains there is still function, although the function may be decreased. Grade 3 strains involve a complete tear of the tendon or muscle, and function is usually impaired. SYMPTOMS   Pain in the lower back.  Pain that affects one side more than the other.  Pain that gets worse with movement and may be felt in the hip, buttocks, or back of the thigh.  Muscle spasms of the muscles in the back.  Swelling along the muscles of the back.  Loss of strength of the back muscles.  Crackling sound (crepitation) when the muscles are touched. CAUSES  Lower back strains occur when a force is placed on the muscles or tendons that is greater than they can handle. Common causes of injury include:  Prolonged overuse of the muscle-tendon units in the lower back, usually from incorrect posture.  A single violent injury or force applied to the back. RISK INCREASES WITH:  Sports that involve twisting forces on the spine or a lot of bending at the waist (football, rugby, weightlifting, bowling, golf, tennis, speed skating, racquetball, swimming, running, gymnastics, diving).  Poor strength and flexibility.  Failure to warm up properly before activity.  Family history of lower back pain or disk disorders.  Previous back injury or surgery (especially fusion).  Poor posture with lifting, especially heavy objects.  Prolonged sitting, especially with poor posture. PREVENTION   Learn and use proper posture when sitting or lifting (maintain  proper posture when sitting, lift using the knees and legs, not at the waist).  Warm up and stretch properly before activity.  Allow for adequate recovery between workouts.  Maintain physical fitness:  Strength, flexibility, and endurance.  Cardiovascular fitness. PROGNOSIS  If treated properly, lower back strains usually heal within 6 weeks. RELATED COMPLICATIONS   Recurring symptoms, resulting in a chronic problem.  Chronic inflammation, scarring, and partial muscle-tendon tear.  Delayed healing or resolution of symptoms.  Prolonged disability. TREATMENT  Treatment first involves the use of ice and medicine, to reduce pain and inflammation. The use of strengthening and stretching exercises may help reduce pain with activity. These exercises may be performed at home or with a therapist. Severe injuries may require referral to a therapist for further evaluation and treatment, such as ultrasound. Your caregiver may advise that you wear a back brace or corset, to help reduce pain and discomfort. Often, prolonged bed rest results in greater harm then benefit. Corticosteroid injections may be recommended. However, these should be reserved for the most serious cases. It is important to avoid using your back when lifting objects. At night, sleep on your back on a firm mattress with a pillow placed under your knees. If non-surgical treatment is unsuccessful, surgery may be needed.  MEDICATION   If pain medicine is needed, nonsteroidal anti-inflammatory medicines (aspirin and ibuprofen), or other minor pain relievers (acetaminophen), are often advised.  Do not take pain medicine for 7 days before surgery.  Prescription pain relievers may be given, if your caregiver thinks they are needed. Use only as   directed and only as much as you need.  Ointments applied to the skin may be helpful.  Corticosteroid injections may be given by your caregiver. These injections should be reserved for the most  serious cases, because they may only be given a certain number of times. HEAT AND COLD  Cold treatment (icing) should be applied for 10 to 15 minutes every 2 to 3 hours for inflammation and pain, and immediately after activity that aggravates your symptoms. Use ice packs or an ice massage.  Heat treatment may be used before performing stretching and strengthening activities prescribed by your caregiver, physical therapist, or athletic trainer. Use a heat pack or a warm water soak. SEEK MEDICAL CARE IF:   Symptoms get worse or do not improve in 2 to 4 weeks, despite treatment.  You develop numbness, weakness, or loss of bowel or bladder function.  New, unexplained symptoms develop. (Drugs used in treatment may produce side effects.) EXERCISES  RANGE OF MOTION (ROM) AND STRETCHING EXERCISES - Low Back Strain Most people with lower back pain will find that their symptoms get worse with excessive bending forward (flexion) or arching at the lower back (extension). The exercises which will help resolve your symptoms will focus on the opposite motion.  Your physician, physical therapist or athletic trainer will help you determine which exercises will be most helpful to resolve your lower back pain. Do not complete any exercises without first consulting with your caregiver. Discontinue any exercises which make your symptoms worse until you speak to your caregiver.  If you have pain, numbness or tingling which travels down into your buttocks, leg or foot, the goal of the therapy is for these symptoms to move closer to your back and eventually resolve. Sometimes, these leg symptoms will get better, but your lower back pain may worsen. This is typically an indication of progress in your rehabilitation. Be very alert to any changes in your symptoms and the activities in which you participated in the 24 hours prior to the change. Sharing this information with your caregiver will allow him/her to most efficiently  treat your condition.  These exercises may help you when beginning to rehabilitate your injury. Your symptoms may resolve with or without further involvement from your physician, physical therapist or athletic trainer. While completing these exercises, remember:  Restoring tissue flexibility helps normal motion to return to the joints. This allows healthier, less painful movement and activity.  An effective stretch should be held for at least 30 seconds.  A stretch should never be painful. You should only feel a gentle lengthening or release in the stretched tissue. FLEXION RANGE OF MOTION AND STRETCHING EXERCISES: STRETCH - Flexion, Single Knee to Chest   Lie on a firm bed or floor with both legs extended in front of you.  Keeping one leg in contact with the floor, bring your opposite knee to your chest. Hold your leg in place by either grabbing behind your thigh or at your knee.  Pull until you feel a gentle stretch in your lower back. Hold __________ seconds.  Slowly release your grasp and repeat the exercise with the opposite side. Repeat __________ times. Complete this exercise __________ times per day.  STRETCH - Flexion, Double Knee to Chest   Lie on a firm bed or floor with both legs extended in front of you.  Keeping one leg in contact with the floor, bring your opposite knee to your chest.  Tense your stomach muscles to support your back and then   lift your other knee to your chest. Hold your legs in place by either grabbing behind your thighs or at your knees.  Pull both knees toward your chest until you feel a gentle stretch in your lower back. Hold __________ seconds.  Tense your stomach muscles and slowly return one leg at a time to the floor. Repeat __________ times. Complete this exercise __________ times per day.  STRETCH - Low Trunk Rotation  Lie on a firm bed or floor. Keeping your legs in front of you, bend your knees so they are both pointed toward the ceiling  and your feet are flat on the floor.  Extend your arms out to the side. This will stabilize your upper body by keeping your shoulders in contact with the floor.  Gently and slowly drop both knees together to one side until you feel a gentle stretch in your lower back. Hold for __________ seconds.  Tense your stomach muscles to support your lower back as you bring your knees back to the starting position. Repeat the exercise to the other side. Repeat __________ times. Complete this exercise __________ times per day  EXTENSION RANGE OF MOTION AND FLEXIBILITY EXERCISES: STRETCH - Extension, Prone on Elbows   Lie on your stomach on the floor, a bed will be too soft. Place your palms about shoulder width apart and at the height of your head.  Place your elbows under your shoulders. If this is too painful, stack pillows under your chest.  Allow your body to relax so that your hips drop lower and make contact more completely with the floor.  Hold this position for __________ seconds.  Slowly return to lying flat on the floor. Repeat __________ times. Complete this exercise __________ times per day.  RANGE OF MOTION - Extension, Prone Press Ups  Lie on your stomach on the floor, a bed will be too soft. Place your palms about shoulder width apart and at the height of your head.  Keeping your back as relaxed as possible, slowly straighten your elbows while keeping your hips on the floor. You may adjust the placement of your hands to maximize your comfort. As you gain motion, your hands will come more underneath your shoulders.  Hold this position __________ seconds.  Slowly return to lying flat on the floor. Repeat __________ times. Complete this exercise __________ times per day.  RANGE OF MOTION- Quadruped, Neutral Spine   Assume a hands and knees position on a firm surface. Keep your hands under your shoulders and your knees under your hips. You may place padding under your knees for  comfort.  Drop your head and point your tail bone toward the ground below you. This will round out your lower back like an angry cat. Hold this position for __________ seconds.  Slowly lift your head and release your tail bone so that your back sags into a large arch, like an old horse.  Hold this position for __________ seconds.  Repeat this until you feel limber in your lower back.  Now, find your "sweet spot." This will be the most comfortable position somewhere between the two previous positions. This is your neutral spine. Once you have found this position, tense your stomach muscles to support your lower back.  Hold this position for __________ seconds. Repeat __________ times. Complete this exercise __________ times per day.  STRENGTHENING EXERCISES - Low Back Strain These exercises may help you when beginning to rehabilitate your injury. These exercises should be done near your "sweet   spot." This is the neutral, low-back arch, somewhere between fully rounded and fully arched, that is your least painful position. When performed in this safe range of motion, these exercises can be used for people who have either a flexion or extension based injury. These exercises may resolve your symptoms with or without further involvement from your physician, physical therapist or athletic trainer. While completing these exercises, remember:   Muscles can gain both the endurance and the strength needed for everyday activities through controlled exercises.  Complete these exercises as instructed by your physician, physical therapist or athletic trainer. Increase the resistance and repetitions only as guided.  You may experience muscle soreness or fatigue, but the pain or discomfort you are trying to eliminate should never worsen during these exercises. If this pain does worsen, stop and make certain you are following the directions exactly. If the pain is still present after adjustments, discontinue the  exercise until you can discuss the trouble with your caregiver. STRENGTHENING - Deep Abdominals, Pelvic Tilt  Lie on a firm bed or floor. Keeping your legs in front of you, bend your knees so they are both pointed toward the ceiling and your feet are flat on the floor.  Tense your lower abdominal muscles to press your lower back into the floor. This motion will rotate your pelvis so that your tail bone is scooping upwards rather than pointing at your feet or into the floor.  With a gentle tension and even breathing, hold this position for __________ seconds. Repeat __________ times. Complete this exercise __________ times per day.  STRENGTHENING - Abdominals, Crunches   Lie on a firm bed or floor. Keeping your legs in front of you, bend your knees so they are both pointed toward the ceiling and your feet are flat on the floor. Cross your arms over your chest.  Slightly tip your chin down without bending your neck.  Tense your abdominals and slowly lift your trunk high enough to just clear your shoulder blades. Lifting higher can put excessive stress on the lower back and does not further strengthen your abdominal muscles.  Control your return to the starting position. Repeat __________ times. Complete this exercise __________ times per day.  STRENGTHENING - Quadruped, Opposite UE/LE Lift   Assume a hands and knees position on a firm surface. Keep your hands under your shoulders and your knees under your hips. You may place padding under your knees for comfort.  Find your neutral spine and gently tense your abdominal muscles so that you can maintain this position. Your shoulders and hips should form a rectangle that is parallel with the floor and is not twisted.  Keeping your trunk steady, lift your right hand no higher than your shoulder and then your left leg no higher than your hip. Make sure you are not holding your breath. Hold this position __________ seconds.  Continuing to keep your  abdominal muscles tense and your back steady, slowly return to your starting position. Repeat with the opposite arm and leg. Repeat __________ times. Complete this exercise __________ times per day.  STRENGTHENING - Lower Abdominals, Double Knee Lift  Lie on a firm bed or floor. Keeping your legs in front of you, bend your knees so they are both pointed toward the ceiling and your feet are flat on the floor.  Tense your abdominal muscles to brace your lower back and slowly lift both of your knees until they come over your hips. Be certain not to hold your breath.    Hold __________ seconds. Using your abdominal muscles, return to the starting position in a slow and controlled manner. Repeat __________ times. Complete this exercise __________ times per day.  POSTURE AND BODY MECHANICS CONSIDERATIONS - Low Back Strain Keeping correct posture when sitting, standing or completing your activities will reduce the stress put on different body tissues, allowing injured tissues a chance to heal and limiting painful experiences. The following are general guidelines for improved posture. Your physician or physical therapist will provide you with any instructions specific to your needs. While reading these guidelines, remember:  The exercises prescribed by your provider will help you have the flexibility and strength to maintain correct postures.  The correct posture provides the best environment for your joints to work. All of your joints have less wear and tear when properly supported by a spine with good posture. This means you will experience a healthier, less painful body.  Correct posture must be practiced with all of your activities, especially prolonged sitting and standing. Correct posture is as important when doing repetitive low-stress activities (typing) as it is when doing a single heavy-load activity (lifting). RESTING POSITIONS Consider which positions are most painful for you when choosing a  resting position. If you have pain with flexion-based activities (sitting, bending, stooping, squatting), choose a position that allows you to rest in a less flexed posture. You would want to avoid curling into a fetal position on your side. If your pain worsens with extension-based activities (prolonged standing, working overhead), avoid resting in an extended position such as sleeping on your stomach. Most people will find more comfort when they rest with their spine in a more neutral position, neither too rounded nor too arched. Lying on a non-sagging bed on your side with a pillow between your knees, or on your back with a pillow under your knees will often provide some relief. Keep in mind, being in any one position for a prolonged period of time, no matter how correct your posture, can still lead to stiffness. PROPER SITTING POSTURE In order to minimize stress and discomfort on your spine, you must sit with correct posture. Sitting with good posture should be effortless for a healthy body. Returning to good posture is a gradual process. Many people can work toward this most comfortably by using various supports until they have the flexibility and strength to maintain this posture on their own. When sitting with proper posture, your ears will fall over your shoulders and your shoulders will fall over your hips. You should use the back of the chair to support your upper back. Your lower back will be in a neutral position, just slightly arched. You may place a small pillow or folded towel at the base of your lower back for support.  When working at a desk, create an environment that supports good, upright posture. Without extra support, muscles tire, which leads to excessive strain on joints and other tissues. Keep these recommendations in mind: CHAIR:  A chair should be able to slide under your desk when your back makes contact with the back of the chair. This allows you to work closely.  The chair's  height should allow your eyes to be level with the upper part of your monitor and your hands to be slightly lower than your elbows. BODY POSITION  Your feet should make contact with the floor. If this is not possible, use a foot rest.  Keep your ears over your shoulders. This will reduce stress on your neck and   lower back. INCORRECT SITTING POSTURES  If you are feeling tired and unable to assume a healthy sitting posture, do not slouch or slump. This puts excessive strain on your back tissues, causing more damage and pain. Healthier options include:  Using more support, like a lumbar pillow.  Switching tasks to something that requires you to be upright or walking.  Talking a brief walk.  Lying down to rest in a neutral-spine position. PROLONGED STANDING WHILE SLIGHTLY LEANING FORWARD  When completing a task that requires you to lean forward while standing in one place for a long time, place either foot up on a stationary 2-4 inch high object to help maintain the best posture. When both feet are on the ground, the lower back tends to lose its slight inward curve. If this curve flattens (or becomes too large), then the back and your other joints will experience too much stress, tire more quickly, and can cause pain. CORRECT STANDING POSTURES Proper standing posture should be assumed with all daily activities, even if they only take a few moments, like when brushing your teeth. As in sitting, your ears should fall over your shoulders and your shoulders should fall over your hips. You should keep a slight tension in your abdominal muscles to brace your spine. Your tailbone should point down to the ground, not behind your body, resulting in an over-extended swayback posture.  INCORRECT STANDING POSTURES  Common incorrect standing postures include a forward head, locked knees and/or an excessive swayback. WALKING Walk with an upright posture. Your ears, shoulders and hips should all  line-up. PROLONGED ACTIVITY IN A FLEXED POSITION When completing a task that requires you to bend forward at your waist or lean over a low surface, try to find a way to stabilize 3 out of 4 of your limbs. You can place a hand or elbow on your thigh or rest a knee on the surface you are reaching across. This will provide you more stability so that your muscles do not fatigue as quickly. By keeping your knees relaxed, or slightly bent, you will also reduce stress across your lower back. CORRECT LIFTING TECHNIQUES DO :   Assume a wide stance. This will provide you more stability and the opportunity to get as close as possible to the object which you are lifting.  Tense your abdominals to brace your spine. Bend at the knees and hips. Keeping your back locked in a neutral-spine position, lift using your leg muscles. Lift with your legs, keeping your back straight.  Test the weight of unknown objects before attempting to lift them.  Try to keep your elbows locked down at your sides in order get the best strength from your shoulders when carrying an object.  Always ask for help when lifting heavy or awkward objects. INCORRECT LIFTING TECHNIQUES DO NOT:   Lock your knees when lifting, even if it is a small object.  Bend and twist. Pivot at your feet or move your feet when needing to change directions.  Assume that you can safely pick up even a paper clip without proper posture. Document Released: 03/12/2005 Document Revised: 06/04/2011 Document Reviewed: 06/24/2008 [facility] Patient Information 2015 [place], [place]. This information is not intended to replace advice given to you by your health care provider. Make sure you discuss any questions you have with your health care provider. Back Exercises Back exercises help treat and prevent back injuries. The goal of back exercises is to increase the strength of your abdominal and back muscles  and the flexibility of your back. These exercises should be  started when you no longer have back pain. Back exercises include:  Pelvic Tilt. Lie on your back with your knees bent. Tilt your pelvis until the lower part of your back is against the floor. Hold this position 5 to 10 sec and repeat 5 to 10 times.  Knee to Chest. Pull first 1 knee up against your chest and hold for 20 to 30 seconds, repeat this with the other knee, and then both knees. This may be done with the other leg straight or bent, whichever feels better.  Sit-Ups or Curl-Ups. Bend your knees 90 degrees. Start with tilting your pelvis, and do a partial, slow sit-up, lifting your trunk only 30 to 45 degrees off the floor. Take at least 2 to 3 seconds for each sit-up. Do not do sit-ups with your knees out straight. If partial sit-ups are difficult, simply do the above but with only tightening your abdominal muscles and holding it as directed.  Hip-Lift. Lie on your back with your knees flexed 90 degrees. Push down with your feet and shoulders as you raise your hips a couple inches off the floor; hold for 10 seconds, repeat 5 to 10 times.  Back arches. Lie on your stomach, propping yourself up on bent elbows. Slowly press on your hands, causing an arch in your low back. Repeat 3 to 5 times. Any initial stiffness and discomfort should lessen with repetition over time.  Shoulder-Lifts. Lie face down with arms beside your body. Keep hips and torso pressed to floor as you slowly lift your head and shoulders off the floor. Do not overdo your exercises, especially in the beginning. Exercises may cause you some mild back discomfort which lasts for a few minutes; however, if the pain is more severe, or lasts for more than 15 minutes, do not continue exercises until you see your caregiver. Improvement with exercise therapy for back problems is slow.  See your caregivers for assistance with developing a proper back exercise program. Document Released: 04/19/2004 Document Revised: 06/04/2011 Document  Reviewed: 01/11/2011 [facility] Patient Information 2015 [place], [place]. This information is not intended to replace advice given to you by your health care provider. Make sure you discuss any questions you have with your health care provider. Headache and Arthritis Headaches and arthritis are common problems. This causes an interest in the possible role of arthritis in causing headaches. Several major forms of arthritis exist. Two of the most common types are:  Rheumatoid arthritis.  Osteoarthritis. Rheumatoid arthritis may begin at any age. It is a condition in which the body attacks some of its own tissues, thinking they do not belong. This leads to destruction of the bony areas around the joints. This condition may afflict any of the body's joints. It usually produces a deformity of the joint. The hands and fingers no longer appear straight but often appear angled towards one side. In some cases, the spine may be involved. Most often it is the vertebrae of the neck (cervical spine). The areas of the neck most commonly afflicted by rheumatoid arthritis are the first and second cervical vertebrae. Curiously, rheumatoid arthritis, though it often produces severe deformities, is not always painful.  The more common form of arthritis is osteoarthritis. It is a wear-and-tear form of arthritis. It usually does not produce deformity of the joints or destruction of the bony tissues. Rather the ligaments weaken. They may be calcified due to the body's attempt to heal  the damage. The larger joints of the body and those joints that take the most stress and strain are the most often affected. In the neck region this osteoarthritis usually involves the fifth, sixth and seventh vertebrae. This is because the effects of posture produce the most fatigue on them. Osteoarthritis is often more painful than rheumatoid arthritis.  During workups for arthritis, a test evaluating inflammation, (the sedimentation rate) often is  performed. In rheumatoid arthritis, this test will usually be elevated. Other tests for inflammation may also be elevated. In patients with osteoarthritis, x-rays of the neck or jaw joints will show changes from "lipping" of the vertebrae. This is caused by calcium deposits in the ligaments. Or they may show narrowing of the space between the vertebrae, or spur formation (from calcium deposits). If severe, it may cause obstruction of the holes where the nerves pass from the spine to the body. In rheumatoid arthritis, dislocation of vertebrae may occur in the upper neck. CT scan and MRI in patients with osteoarthritis may show bulging of the discs that cushion the vertebrae. In the most severe cases, herniation of the discs may occur.  Headaches, felt as a pain in the neck, may be caused by arthritis if the first, second or third vertebrae are involved. This condition is due to the nerves that supply the scalp only originating from this area of the spine. Neck pain itself, whether alone or coupled with headaches, can involve any portion of the neck. If the jaw is involved, the symptoms are similar to those of Temporomandibular Joint Syndrome (TMJ).  The progressive severity of rheumatoid arthritis may be slowed by a variety of potent medications. In osteoarthritis, its progression is not usually hindered by medication. The following may be helpful in slowing the advancement of the disorder:  Lifestyle adjustment.  Exercise.  Rest.  Weight loss. Medications, such as the nonsteroidal anti-inflammatory agents (NSAIDs), are useful. They may reduce the pain and improve the reduced motion which occurs in joints afflicted by arthritis. From some studies, the use of acetaminophen appears to be as effective in controlling the pain of arthritis as the NSAIDs. Physical modalities may also be useful for arthritis. They include:  Heat.  Massage.  Exercise. But physical therapy must be prescribed by a caregiver,  just as most medications for arthritis.  Document Released: 06/02/2003 Document Revised: 03/17/2013 Document Reviewed: 06/15/2013 [facility] Patient Information 2015 [place], [place]. This information is not intended to replace advice given to you by your health care provider. Make sure you discuss any questions you have with your health care provider. General Headache Without Cause A headache is pain or discomfort felt around the head or neck area. The specific cause of a headache may not be found. There are many causes and types of headaches. A few common ones are:  Tension headaches.  Migraine headaches.  Cluster headaches.  Chronic daily headaches. HOME CARE INSTRUCTIONS   Keep all follow-up appointments with your caregiver or any specialist referral.  Only take over-the-counter or prescription medicines for pain or discomfort as directed by your caregiver.  Lie down in a dark, quiet room when you have a headache.  Keep a headache journal to find out what may trigger your migraine headaches. For example, write down:  What you eat and drink.  How much sleep you get.  Any change to your diet or medicines.  Try massage or other relaxation techniques.  Put ice packs or heat on the head and neck. Use these 3  to 4 times per day for 15 to 20 minutes each time, or as needed.  Limit stress.  Sit up straight, and do not tense your muscles.  Quit smoking if you smoke.  Limit alcohol use.  Decrease the amount of caffeine you drink, or stop drinking caffeine.  Eat and sleep on a regular schedule.  Get 7 to 9 hours of sleep, or as recommended by your caregiver.  Keep lights dim if bright lights bother you and make your headaches worse. SEEK MEDICAL CARE IF:   You have problems with the medicines you were prescribed.  Your medicines are not working.  You have a change from the usual headache.  You have nausea or vomiting. SEEK IMMEDIATE MEDICAL CARE IF:   Your headache  becomes severe.  You have a fever.  You have a stiff neck.  You have loss of vision.  You have muscular weakness or loss of muscle control.  You start losing your balance or have trouble walking.  You feel faint or pass out.  You have severe symptoms that are different from your first symptoms. MAKE SURE YOU:   Understand these instructions.  Will watch your condition.  Will get help right away if you are not doing well or get worse. Document Released: 03/12/2005 Document Revised: 06/04/2011 Document Reviewed: 03/28/2011 [facility] Patient Information 2015 [place], [place]. This information is not intended to replace advice given to you by your health care provider. Make sure you discuss any questions you have with your health care provider.

## 2014-11-22 NOTE — Progress Notes (Signed)
Patient ID: Kyle Swanson, male   DOB: 06/06/1965, 49 y.o.   MRN: [id number]   11/22/2014 at 10:30 AM  Kyle Swanson / DOB: 02-16-1966 / MRN: [id number]  Problem list reviewed and updated by me where necessary.   SUBJECTIVE  Kyle Swanson is a 49 y.o. well appearing male presenting for the chief complaint of pain across low back persistant for 5-6 months. Dr. Tanja Port evaluated and xrayed 1 month ago then referred for a medical w/up.Marland Kitchen   He had a normal xr ls spine 3v at ortho office. He has no loss of strength, sensation, or incontinence. No fever or night sweats   He  has a past medical history of Allergy; Asthma; Allergic rhinitis (04/28/2012); and Anxiety.    Medications reviewed and updated by myself where necessary, and exist elsewhere in the encounter.   Kyle Swanson is allergic to sulfa antibiotics. He  reports that he has never smoked. He does not have any smokeless tobacco history on file. He reports that he drinks alcohol. He reports that he does not use illicit drugs. He  reports that he currently engages in sexual activity. He reports using the following methods of birth control/protection: Condom and IUD. The patient  has past surgical history that includes Tonsillectomy and adenoidectomy and Ganglion cyst excision.  His family history includes COPD in his father; Cancer in his maternal grandmother and paternal grandmother; Diabetes in his paternal grandfather.  Review of Systems  Constitutional: Negative for fever, chills, weight loss, malaise/fatigue and diaphoresis.  HENT: Positive for congestion.   Respiratory: Negative for cough and shortness of breath.   Cardiovascular: Negative for chest pain.  Gastrointestinal: Negative for nausea, diarrhea and constipation.  Genitourinary: Positive for urgency and frequency. Negative for dysuria, hematuria and flank pain.  Musculoskeletal: Positive for myalgias and back pain. Negative for joint pain and falls.  Skin: Negative for rash.    Neurological: Negative for dizziness, tremors, sensory change, focal weakness, weakness and headaches.    OBJECTIVE  His  height is 6\' 2"  (1.88 m) and weight is 198 lb (89.812 kg). His oral temperature is 97.8 F (36.6 C). His blood pressure is 116/80 and his pulse is 82. His respiration is 16 and oxygen saturation is 98%.  The patient's body mass index is 25.41 kg/(m^2).  Physical Exam  Constitutional: He is oriented to person, place, and time. He appears well-developed and well-nourished. No distress.  HENT:  Head: Normocephalic.  Nose: Nose normal.  Eyes: Conjunctivae and EOM are normal. Pupils are equal, round, and reactive to light.  Neck: Normal range of motion.  Cardiovascular: Normal rate, regular rhythm and normal heart sounds.   Respiratory: Effort normal and breath sounds normal. He exhibits no tenderness.  GI: Soft. Bowel sounds are normal. He exhibits no distension and no mass. There is no tenderness. There is no rebound.  Musculoskeletal: He exhibits tenderness.       Lumbar back: He exhibits tenderness, pain and spasm. He exhibits normal range of motion, no bony tenderness, no swelling, no edema and no deformity.  Neurological: He is alert and oriented to person, place, and time. He has normal reflexes. No sensory deficit. He exhibits normal muscle tone. He displays a negative Romberg sign. Coordination and gait normal.  Straight leg raise normal.  Skin: Skin is intact. No rash noted.  Psychiatric: He has a normal mood and affect.   [facility] reading (PRIMARY) by  Dr Elder Cyphers on ap view possible fx of L5 right pedicle. Also on lateral  L4 anterior dorsal body avulsion possible, stat read please--pars defect and spondylosis.   Results for orders placed or performed in visit on 11/22/14 (from the past 24 hour(s))  POCT urinalysis dipstick     Status: None   Collection Time: 11/22/14 10:02 AM  Result Value Ref Range   Color, UA yellow    Clarity, UA clear    Glucose, UA neg     Bilirubin, UA neg    Ketones, UA neg    Spec Grav, UA 1.020    Blood, UA trace-intact    pH, UA 6.0    Protein, UA neg    Urobilinogen, UA 0.2    Nitrite, UA neg    Leukocytes, UA Negative Negative  POCT CBC     Status: None   Collection Time: 11/22/14 10:03 AM  Result Value Ref Range   WBC 6.4 4.6 - 10.2 K/uL   Lymph, poc 1.8 0.6 - 3.4   POC LYMPH PERCENT 28.8 10 - 50 %L   MID (cbc) 0.3 0 - 0.9   POC MID % 5.4 0 - 12 %M   POC Granulocyte 4.2 2 - 6.9   Granulocyte percent 65.8 37 - 80 %G   RBC 5.19 4.69 - 6.13 M/uL   Hemoglobin 15.3 14.1 - 18.1 g/dL   HCT, POC 45.9 43.5 - 53.7 %   MCV 88.3 80 - 97 fL   MCH, POC 29.5 27 - 31.2 pg   MCHC 33.4 31.8 - 35.4 g/dL   RDW, POC 13.6 %   Platelet Count, POC 228 142 - 424 K/uL   MPV 6.6 0 - 99.8 fL  POCT UA - Microscopic Only     Status: None   Collection Time: 11/22/14 10:03 AM  Result Value Ref Range   WBC, Ur, HPF, POC neg    RBC, urine, microscopic 0-2    Bacteria, U Microscopic trace    Mucus, UA positive    Epithelial cells, urine per micros 0-1    Crystals, Ur, HPF, POC neg    Casts, Ur, LPF, POC neg    Yeast, UA neg     ASSESSMENT & PLAN  Kyle Swanson was seen today for pain in lower back and headache.  Diagnoses and all orders for this visit:  Chronic LBP -     DG Lumbar Spine Complete -     POCT CBC -     POCT SEDIMENTATION RATE -     POCT UA - Microscopic Only -     POCT urinalysis dipstick -     Comprehensive metabolic panel  Other headache syndrome -     DG Lumbar Spine Complete -     POCT CBC -     POCT SEDIMENTATION RATE -     POCT UA - Microscopic Only -     POCT urinalysis dipstick -     Comprehensive metabolic panel  Low back strain, initial encounter -     DG Lumbar Spine Complete -     POCT CBC -     POCT SEDIMENTATION RATE -     POCT UA - Microscopic Only -     POCT urinalysis dipstick -     Comprehensive metabolic panel

## 2014-11-23 ENCOUNTER — Encounter: Payer: Self-pay | Admitting: Family Medicine

## 2014-11-24 ENCOUNTER — Telehealth: Payer: Self-pay

## 2014-11-24 NOTE — Telephone Encounter (Signed)
[facility] needs an order for the 15-20 physical therapy sessions for the patient, as well as what they are treating him for.  The referral has been submitted to them, but an order is still needed.  Thank you.  Fax number: [phone] Phone number: [phone]

## 2014-11-24 NOTE — Telephone Encounter (Signed)
Dr. Guest pt. 

## 2014-11-24 NOTE — Telephone Encounter (Signed)
Please order

## 2014-11-25 ENCOUNTER — Other Ambulatory Visit: Payer: Self-pay | Admitting: Physician Assistant

## 2014-11-25 DIAGNOSIS — M545 Low back pain: Secondary | ICD-10-CM

## 2014-11-25 NOTE — Telephone Encounter (Signed)
Diagnosis is back pain.  PT eval and treat is ordered.

## 2014-12-03 ENCOUNTER — Telehealth: Payer: Self-pay

## 2014-12-03 DIAGNOSIS — R945 Abnormal results of liver function studies: Principal | ICD-10-CM

## 2014-12-03 DIAGNOSIS — R7989 Other specified abnormal findings of blood chemistry: Secondary | ICD-10-CM

## 2014-12-03 NOTE — Telephone Encounter (Signed)
Patient received his labs in the mail and a note from Dr Elder Cyphers. Per patient the notes stated Dr Elder Cyphers wanted him not to have any alcohol for 2 weeks and return for liver testing. Patient plans to come in the 3rd week of this month. Patient is requesting the order to be put in so when he comes in he can be fast track for labs only. Patients call back number just in case is [phone]

## 2014-12-06 NOTE — Telephone Encounter (Signed)
I put an order in for a CMET and the patient was advised and he will rtc around the 26 of this month to assure he has been his full weeks clean from alcohol.

## 2014-12-16 ENCOUNTER — Other Ambulatory Visit (INDEPENDENT_AMBULATORY_CARE_PROVIDER_SITE_OTHER): Payer: 59 | Admitting: *Deleted

## 2014-12-16 DIAGNOSIS — R7989 Other specified abnormal findings of blood chemistry: Secondary | ICD-10-CM | POA: Diagnosis not present

## 2014-12-16 DIAGNOSIS — R945 Abnormal results of liver function studies: Principal | ICD-10-CM

## 2014-12-16 LAB — COMPREHENSIVE METABOLIC PANEL
ALK PHOS: 55 U/L (ref 40–115)
ALT: 72 U/L — AB (ref 9–46)
AST: 34 U/L (ref 10–40)
Albumin: 4.4 g/dL (ref 3.6–5.1)
BILIRUBIN TOTAL: 0.5 mg/dL (ref 0.2–1.2)
BUN: 16 mg/dL (ref 7–25)
CO2: 29 mmol/L (ref 20–31)
Calcium: 9.5 mg/dL (ref 8.6–10.3)
Chloride: 102 mmol/L (ref 98–110)
Creat: 1.02 mg/dL (ref 0.60–1.35)
GLUCOSE: 87 mg/dL (ref 65–99)
POTASSIUM: 4.9 mmol/L (ref 3.5–5.3)
Sodium: 139 mmol/L (ref 135–146)
Total Protein: 6.5 g/dL (ref 6.1–8.1)

## 2014-12-16 NOTE — Progress Notes (Signed)
Patient here today for labs only. CMP drawn

## 2014-12-22 ENCOUNTER — Encounter: Payer: Self-pay | Admitting: Family Medicine

## 2015-01-31 ENCOUNTER — Encounter: Payer: Self-pay | Admitting: Family Medicine

## 2015-01-31 ENCOUNTER — Ambulatory Visit (INDEPENDENT_AMBULATORY_CARE_PROVIDER_SITE_OTHER): Payer: 59 | Admitting: Family Medicine

## 2015-01-31 VITALS — BP 126/78 | HR 76 | Temp 97.9°F | Resp 16 | Ht 73.5 in | Wt 200.8 lb

## 2015-01-31 DIAGNOSIS — R350 Frequency of micturition: Secondary | ICD-10-CM

## 2015-01-31 DIAGNOSIS — Z0000 Encounter for general adult medical examination without abnormal findings: Secondary | ICD-10-CM

## 2015-01-31 DIAGNOSIS — R945 Abnormal results of liver function studies: Secondary | ICD-10-CM

## 2015-01-31 DIAGNOSIS — R7989 Other specified abnormal findings of blood chemistry: Secondary | ICD-10-CM

## 2015-01-31 DIAGNOSIS — R319 Hematuria, unspecified: Secondary | ICD-10-CM

## 2015-01-31 DIAGNOSIS — Z13220 Encounter for screening for lipoid disorders: Secondary | ICD-10-CM | POA: Diagnosis not present

## 2015-01-31 LAB — LIPID PANEL
CHOL/HDL RATIO: 3 ratio (ref <=5.0)
Cholesterol: 189 mg/dL (ref 125–200)
HDL: 63 mg/dL (ref >=40)
LDL CALC: 110 mg/dL (ref <130)
Triglycerides: 78 mg/dL (ref <150)
VLDL: 16 mg/dL (ref <30)

## 2015-01-31 LAB — CBC WITH DIFFERENTIAL/PLATELET
BASOS PCT: 0 % (ref 0–1)
Basophils Absolute: 0 10*3/uL (ref 0.0–0.1)
EOS ABS: 0.2 10*3/uL (ref 0.0–0.7)
EOS PCT: 4 % (ref 0–5)
HCT: 44.1 % (ref 39.0–52.0)
Hemoglobin: 15.2 g/dL (ref 13.0–17.0)
LYMPHS ABS: 1.4 10*3/uL (ref 0.7–4.0)
Lymphocytes Relative: 27 % (ref 12–46)
MCH: 30.5 pg (ref 26.0–34.0)
MCHC: 34.5 g/dL (ref 30.0–36.0)
MCV: 88.4 fL (ref 78.0–100.0)
MONOS PCT: 10 % (ref 3–12)
MPV: 8.8 fL (ref 8.6–12.4)
Monocytes Absolute: 0.5 10*3/uL (ref 0.1–1.0)
NEUTROS PCT: 59 % (ref 43–77)
Neutro Abs: 3 10*3/uL (ref 1.7–7.7)
PLATELETS: 187 10*3/uL (ref 150–400)
RBC: 4.99 MIL/uL (ref 4.22–5.81)
RDW: 13 % (ref 11.5–15.5)
WBC: 5.1 10*3/uL (ref 4.0–10.5)

## 2015-01-31 LAB — POCT URINALYSIS DIP (MANUAL ENTRY)
BILIRUBIN UA: NEGATIVE
GLUCOSE UA: NEGATIVE
Ketones, POC UA: NEGATIVE
Leukocytes, UA: NEGATIVE
NITRITE UA: NEGATIVE
PH UA: 7
Protein Ur, POC: NEGATIVE
SPEC GRAV UA: 1.02
UROBILINOGEN UA: 0.2

## 2015-01-31 LAB — COMPLETE METABOLIC PANEL WITH GFR
ALT: 88 U/L — ABNORMAL HIGH (ref 9–46)
AST: 42 U/L — ABNORMAL HIGH (ref 10–40)
Albumin: 4.1 g/dL (ref 3.6–5.1)
Alkaline Phosphatase: 60 U/L (ref 40–115)
BUN: 19 mg/dL (ref 7–25)
CO2: 30 mmol/L (ref 20–31)
Calcium: 9.1 mg/dL (ref 8.6–10.3)
Chloride: 104 mmol/L (ref 98–110)
Creat: 1.02 mg/dL (ref 0.60–1.35)
GFR, EST NON AFRICAN AMERICAN: 87 mL/min (ref >=60)
GFR, Est African American: >89 mL/min (ref >=60)
GLUCOSE: 85 mg/dL (ref 65–99)
POTASSIUM: 4.6 mmol/L (ref 3.5–5.3)
SODIUM: 141 mmol/L (ref 135–146)
Total Bilirubin: 0.6 mg/dL (ref 0.2–1.2)
Total Protein: 6.8 g/dL (ref 6.1–8.1)

## 2015-01-31 LAB — POC MICROSCOPIC URINALYSIS (UMFC): MUCUS RE: ABSENT

## 2015-01-31 NOTE — Progress Notes (Signed)
   Subjective:    Patient ID: Kyle Swanson, male    DOB: 1965-07-09, 49 y.o.   MRN: [id number]  HPI    Review of Systems  Genitourinary: Positive for urgency and frequency.  Musculoskeletal: Positive for back pain.  Allergic/Immunologic: Positive for environmental allergies.       Objective:   Physical Exam        Assessment & Plan:

## 2015-01-31 NOTE — Patient Instructions (Addendum)
We will recheck liver tests and if elevated, can check hepatitis testing and ultrasound of liver.  Decrease caffeine intake as this may be impacting frequency of urination. We will check prostate test again and other urine test.  Few red blood cells there before. If these are still present - may have you see urologist.  Kyle Swanson should receive a call or letter about your lab results within the next week to 10 days.  Return to the clinic or go to the nearest emergency room if any of your symptoms worsen or new symptoms occur.  Keeping you healthy  Get these tests  Blood pressure- Have your blood pressure checked once a year by your healthcare provider.  Normal blood pressure is 120/80.  Weight- Have your body mass index (BMI) calculated to screen for obesity.  BMI is a measure of body fat based on height and weight. You can also calculate your own BMI at [employer].it.  Cholesterol- Have your cholesterol checked regularly starting at age 69, sooner may be necessary if you have diabetes, high blood pressure, if a family member developed heart diseases at an early age or if you smoke.   Chlamydia, HIV, and other sexual transmitted disease- Get screened each year until the age of 84 then within three months of each new sexual partner.  Diabetes- Have your blood sugar checked regularly if you have high blood pressure, high cholesterol, a family history of diabetes or if you are overweight.  Get these vaccines  Flu shot- Every fall.  Tetanus shot- Every 10 years.  Menactra- Single dose; prevents meningitis.  Take these steps  Don't smoke- If you do smoke, ask your healthcare provider about quitting. For tips on how to quit, go to www.smokefree.gov or call 1-800-QUIT-NOW.  Be physically active- Exercise 5 days a week for at least 30 minutes.  If you are not already physically active start slow and gradually work up to 30 minutes of moderate physical activity.  Examples of moderate  activity include walking briskly, mowing the yard, dancing, swimming bicycling, etc.  Eat a healthy diet- Eat a variety of healthy foods such as fruits, vegetables, low fat milk, low fat cheese, yogurt, lean meats, poultry, fish, beans, tofu, etc.  For more information on healthy eating, go to www.thenutritionsource.org  Drink alcohol in moderation- Limit alcohol intake two drinks or less a day.  Never drink and drive.  Dentist- Brush and floss teeth twice daily; visit your dentis twice a year.  Depression-Your emotional health is as important as your physical health.  If you're feeling down, losing interest in things you normally enjoy please talk with your healthcare provider.  Gun Safety- If you keep a gun in your home, keep it unloaded and with the safety lock on.  Bullets should be stored separately.  Helmet use- Always wear a helmet when riding a motorcycle, bicycle, rollerblading or skateboarding.  Safe sex- If you may be exposed to a sexually transmitted infection, use a condom  Seat belts- Seat bels can save your life; always wear one.  Smoke/Carbon Monoxide detectors- These detectors need to be installed on the appropriate level of your home.  Replace batteries at least once a year.  Skin Cancer- When out in the sun, cover up and use sunscreen SPF 15 or higher. Violence- If anyone is threatening or hurting you, please tell your healthcare provider.

## 2015-01-31 NOTE — Progress Notes (Addendum)
Subjective:    Patient ID: Kyle Swanson, male    DOB: 10-10-1965, 49 y.o.   MRN: [id number] This chart was scribed for Kyle Ray, MD by Zola Button, Medical Scribe. This patient was seen in Room 26 and the patient's care was started at 9:17 AM.    HPI HPI Comments: Kyle Swanson is a 49 y.o. male who presents to the [facility] for a complete physical exam. Last saw me 2 years ago. Of note he was having urinary symptoms at that time, but had a normal UA. Advised to avoid late night fluids. Recommended repeat urine in a few weeks. Has few red blood cells on UA. He was not seen again in our office until 2016. Patient is fasting today.  Elevated liver function tests: See last office visit with Dr. Elder Cyphers on August 29th. AST was 43, ALT was 72. Advised to avoid alcohol. Improved on September 22nd. Patient states he has been drinking less alcohol; up to 3 drinks a night for 0-3 nights a week when with friends. Lab Results  Component Value Date   ALT 72* 12/16/2014   AST 34 12/16/2014   ALKPHOS 55 12/16/2014   BILITOT 0.5 12/16/2014    Cancer screening:  Colon cancer screening - No FMHx of colon cancer. Prostate cancer screening - Patient has been having about 1-2 episodes of nocturia most nights, but some nights without any episodes. This has been unchanged since he last saw me. He has on average 3 caffeinated drinks a night. No FMHx of prostate cancer. Lab Results  Component Value Date   PSA 1.16 10/06/2012   PSA 1.11 04/28/2012    Immunizations:  Immunization History  Administered Date(s) Administered  . Influenza, Seasonal, Injecte, Preservative Fre 04/28/2012  . Influenza-Unspecified 12/25/2014  . Tdap 04/28/2012    Depression screening:  Depression screen [facility] 2/9 01/31/2015 11/22/2014  Decreased Interest 0 0  Down, Depressed, Hopeless 0 0  PHQ - 2 Score 0 0   Vision:  Uses corrective lenses. He saw his eye doctor last week.  Visual Acuity Screening   Right eye Left eye Both eyes  Without correction:     With correction: _0    Dentist: Sees a dentist twice a year.  Exercise: He wants to exercise, but has not been cleared by physical therapy.  Anxiety: Last seen by me in July 2014. He was thought to have stress/anxiety/adjustment component with recent stressors, and palpitations due to caffeine intake. He was prescribed Xanax for prn use only. He is no longer taking Xanax and has been managing fine at work.  Chronic Low Back Pain: Patient saw Dr. Elder Cyphers a few months ago for low back pain. He has seen [employer] for this and has also been going through physical therapy.  Patient Active Problem List   Diagnosis Date Noted  . Palpitations 01/05/2013  . Allergy   . Asthma   . Allergic rhinitis 04/28/2012   Past Medical History  Diagnosis Date  . Allergy   . Asthma   . Allergic rhinitis 04/28/2012  . Anxiety    Past Surgical History  Procedure Laterality Date  . Tonsillectomy and adenoidectomy      childhood  . Ganglion cyst excision      left wrist 10 years ago   Allergies  Allergen Reactions  . Sulfa Antibiotics Rash   Prior to Admission medications   Medication Sig Start Date End Date Taking? Authorizing Provider  fexofenadine (ALLEGRA) 60  MG tablet Take 60 mg by mouth 2 (two) times daily.   Yes Historical Provider, MD  methocarbamol (ROBAXIN-750) 750 MG tablet Take 1 tablet (750 mg total) by mouth every 8 (eight) hours as needed for muscle spasms. 11/22/14  Yes Orma Flaming, MD  Triamcinolone Acetonide (NASACORT AQ NA) Place into the nose.   Yes Historical Provider, MD  ALPRAZolam Duanne Moron) 0.25 MG tablet take 1 tablet by mouth twice a day if needed for anxiety Patient not taking: Reported on 11/22/2014 12/31/12   Wendie Agreste, MD   Social History   Social History  . Marital Status: Single    Spouse Name: N/A  . Number of Children: N/A  . Years of Education: N/A   Occupational History  .  [employer]    Social History Main Topics  . Smoking status: Never Smoker   . Smokeless tobacco: Not on file  . Alcohol Use: 7.2 oz/week    12 Standard drinks or equivalent per week     Comment: BEER  . Drug Use: No  . Sexual Activity: Yes    Birth Control/ Protection: Condom, IUD     Comment: number of sex partners in the last 29 months - 7   Other Topics Concern  . Not on file   Social History Narrative   Divorced. Education: [employer]. Exercise: No.     Review of Systems  Genitourinary: Positive for urgency and frequency.  Musculoskeletal: Positive for back pain.  Allergic/Immunologic: Positive for environmental allergies.  13 point ROS reviewed on patient health survey. Negative other than listed above or in nursing note. See nursing note.      Objective:   Physical Exam  Constitutional: He is oriented to person, place, and time. He appears well-developed and well-nourished. No distress.  HENT:  Head: Normocephalic and atraumatic.  Mouth/Throat: Oropharynx is clear and moist. No oropharyngeal exudate.  Eyes: Pupils are equal, round, and reactive to light.  Neck: Neck supple.  Cardiovascular: Normal rate.   Pulmonary/Chest: Effort normal.  Musculoskeletal: He exhibits no edema.  Neurological: He is alert and oriented to person, place, and time. No cranial nerve deficit.  Skin: Skin is warm and dry. No rash noted.  Psychiatric: He has a normal mood and affect. His behavior is normal.  Nursing note and vitals reviewed.     Filed Vitals:   01/31/15 0838  BP: 126/78  Pulse: 76  Temp: 97.9 F (36.6 C)  TempSrc: Oral  Resp: 16  Height: 6' 1.5" (1.867 m)  Weight: 200 lb 12.8 oz (91.082 kg)  SpO2: 96%    Results for orders placed or performed in visit on 01/31/15  Lipid panel  Result Value Ref Range   Cholesterol 189 125 - 200 mg/dL   Triglycerides 78 <150 mg/dL   HDL 63 >=40 mg/dL   Total CHOL/HDL Ratio 3.0 <=5.0 Ratio   VLDL 16 <30 mg/dL   LDL  Cholesterol 110 <130 mg/dL  COMPLETE METABOLIC PANEL WITH GFR  Result Value Ref Range   Sodium 141 135 - 146 mmol/L   Potassium 4.6 3.5 - 5.3 mmol/L   Chloride 104 98 - 110 mmol/L   CO2 30 20 - 31 mmol/L   Glucose, Bld 85 65 - 99 mg/dL   BUN 19 7 - 25 mg/dL   Creat 1.02 0.60 - 1.35 mg/dL   Total Bilirubin 0.6 0.2 - 1.2 mg/dL   Alkaline Phosphatase 60 40 - 115 U/L   AST 42 (H) 10 - 40  U/L   ALT 88 (H) 9 - 46 U/L   Total Protein 6.8 6.1 - 8.1 g/dL   Albumin 4.1 3.6 - 5.1 g/dL   Calcium 9.1 8.6 - 10.3 mg/dL   GFR, Est African American >89 >=60 mL/min   GFR, Est Non African American 87 >=60 mL/min  PSA  Result Value Ref Range   PSA 0.83 <=4.00 ng/mL  CBC with Differential/Platelet  Result Value Ref Range   WBC 5.1 4.0 - 10.5 K/uL   RBC 4.99 4.22 - 5.81 MIL/uL   Hemoglobin 15.2 13.0 - 17.0 g/dL   HCT 44.1 39.0 - 52.0 %   MCV 88.4 78.0 - 100.0 fL   MCH 30.5 26.0 - 34.0 pg   MCHC 34.5 30.0 - 36.0 g/dL   RDW 13.0 11.5 - 15.5 %   Platelets 187 150 - 400 K/uL   MPV 8.8 8.6 - 12.4 fL   Neutrophils Relative % 59 43 - 77 %   Neutro Abs 3.0 1.7 - 7.7 K/uL   Lymphocytes Relative 27 12 - 46 %   Lymphs Abs 1.4 0.7 - 4.0 K/uL   Monocytes Relative 10 3 - 12 %   Monocytes Absolute 0.5 0.1 - 1.0 K/uL   Eosinophils Relative 4 0 - 5 %   Eosinophils Absolute 0.2 0.0 - 0.7 K/uL   Basophils Relative 0 0 - 1 %   Basophils Absolute 0.0 0.0 - 0.1 K/uL   Smear Review Criteria for review not met   POCT urinalysis dipstick  Result Value Ref Range   Color, UA light yellow (A) yellow   Clarity, UA clear clear   Glucose, UA negative negative   Bilirubin, UA negative negative   Ketones, POC UA negative negative   Spec Grav, UA 1.020    Blood, UA trace-intact (A) negative   pH, UA 7.0    Protein Ur, POC negative negative   Urobilinogen, UA 0.2    Nitrite, UA Negative Negative   Leukocytes, UA Negative Negative  POCT Microscopic Urinalysis ([facility])  Result Value Ref Range   WBC,UR,HPF,POC None  None WBC/hpf   RBC,UR,HPF,POC None None RBC/hpf   Bacteria None None, Too numerous to count   Mucus Absent Absent   Epithelial Cells, UR Per Microscopy None None, Too numerous to count cells/hpf       Assessment & Plan:   Mikle Sternberg is a 49 y.o. male Annual physical exam - Plan: CBC with Differential/Platelet  - -anticipatory guidance as below in AVS, screening labs above. Health maintenance items as above in HPI discussed/recommended as applicable.   Urinary frequency - Plan: POCT urinalysis dipstick, PSA  - decrease caffeine intake. Psa pending.  No rbc on micro. rtc if persistent.   Elevated LFTs - Plan: COMPLETE METABOLIC PANEL WITH GFR, CANCELED: POCT CBC  -repeat testing - consider ultrasound and hepatitis testing if still elevated.   Screening for hyperlipidemia - Plan: Lipid panel  Hematuria - Plan: POCT Microscopic Urinalysis ([facility])  -no RBC on micro. psa pending.   Meds ordered this encounter  Medications  . fexofenadine (ALLEGRA) 60 MG tablet    Sig: Take 60 mg by mouth 2 (two) times daily.  . Triamcinolone Acetonide (NASACORT AQ NA)    Sig: Place into the nose.   Patient Instructions  We will recheck liver tests and if elevated, can check hepatitis testing and ultrasound of liver.  Decrease caffeine intake as this may be impacting frequency of urination. We will check prostate test again and  other urine test.  Few red blood cells there before. If these are still present - may have you see urologist.  Dennis Bast should receive a call or letter about your lab results within the next week to 10 days.  Return to the clinic or go to the nearest emergency room if any of your symptoms worsen or new symptoms occur.  Keeping you healthy  Get these tests  Blood pressure- Have your blood pressure checked once a year by your healthcare provider.  Normal blood pressure is 120/80.  Weight- Have your body mass index (BMI) calculated to screen for obesity.  BMI is a measure of  body fat based on height and weight. You can also calculate your own BMI at [employer]velBags.it.  Cholesterol- Have your cholesterol checked regularly starting at age 14, sooner may be necessary if you have diabetes, high blood pressure, if a family member developed heart diseases at an early age or if you smoke.   Chlamydia, HIV, and other sexual transmitted disease- Get screened each year until the age of 48 then within three months of each new sexual partner.  Diabetes- Have your blood sugar checked regularly if you have high blood pressure, high cholesterol, a family history of diabetes or if you are overweight.  Get these vaccines  Flu shot- Every fall.  Tetanus shot- Every 10 years.  Menactra- Single dose; prevents meningitis.  Take these steps  Don't smoke- If you do smoke, ask your healthcare provider about quitting. For tips on how to quit, go to www.smokefree.gov or call 1-800-QUIT-NOW.  Be physically active- Exercise 5 days a week for at least 30 minutes.  If you are not already physically active start slow and gradually work up to 30 minutes of moderate physical activity.  Examples of moderate activity include walking briskly, mowing the yard, dancing, swimming bicycling, etc.  Eat a healthy diet- Eat a variety of healthy foods such as fruits, vegetables, low fat milk, low fat cheese, yogurt, lean meats, poultry, fish, beans, tofu, etc.  For more information on healthy eating, go to www.thenutritionsource.org  Drink alcohol in moderation- Limit alcohol intake two drinks or less a day.  Never drink and drive.  Dentist- Brush and floss teeth twice daily; visit your dentis twice a year.  Depression-Your emotional health is as important as your physical health.  If you're feeling down, losing interest in things you normally enjoy please talk with your healthcare provider.  Gun Safety- If you keep a gun in your home, keep it unloaded and with the safety lock on.  Bullets  should be stored separately.  Helmet use- Always wear a helmet when riding a motorcycle, bicycle, rollerblading or skateboarding.  Safe sex- If you may be exposed to a sexually transmitted infection, use a condom  Seat belts- Seat bels can save your life; always wear one.  Smoke/Carbon Monoxide detectors- These detectors need to be installed on the appropriate level of your home.  Replace batteries at least once a year.  Skin Cancer- When out in the sun, cover up and use sunscreen SPF 15 or higher. Violence- If anyone is threatening or hurting you, please tell your healthcare provider.       By signing my name below, I, Zola Button, attest that this documentation has been prepared under the direction and in the presence of Kyle Ray, MD.  Electronically Signed: Zola Button, Medical Scribe. 01/31/2015. 9:17 AM. I personally performed the services described in this documentation, which was scribed in my presence.  The recorded information has been reviewed and considered, and addended by me as needed.

## 2015-02-01 LAB — PSA: PSA: 0.83 ng/mL (ref <=4.00)

## 2016-06-18 ENCOUNTER — Ambulatory Visit (INDEPENDENT_AMBULATORY_CARE_PROVIDER_SITE_OTHER): Payer: 59 | Admitting: Family Medicine

## 2016-06-18 ENCOUNTER — Encounter: Payer: Self-pay | Admitting: Family Medicine

## 2016-06-18 VITALS — BP 137/88 | HR 79 | Temp 97.9°F | Ht 73.5 in | Wt 212.2 lb

## 2016-06-18 DIAGNOSIS — Z0000 Encounter for general adult medical examination without abnormal findings: Secondary | ICD-10-CM | POA: Diagnosis not present

## 2016-06-18 DIAGNOSIS — J309 Allergic rhinitis, unspecified: Secondary | ICD-10-CM | POA: Diagnosis not present

## 2016-06-18 DIAGNOSIS — M545 Low back pain, unspecified: Secondary | ICD-10-CM

## 2016-06-18 DIAGNOSIS — G8929 Other chronic pain: Secondary | ICD-10-CM

## 2016-06-18 DIAGNOSIS — Z13220 Encounter for screening for lipoid disorders: Secondary | ICD-10-CM | POA: Diagnosis not present

## 2016-06-18 DIAGNOSIS — R945 Abnormal results of liver function studies: Secondary | ICD-10-CM

## 2016-06-18 DIAGNOSIS — R7989 Other specified abnormal findings of blood chemistry: Secondary | ICD-10-CM

## 2016-06-18 DIAGNOSIS — Z131 Encounter for screening for diabetes mellitus: Secondary | ICD-10-CM

## 2016-06-18 DIAGNOSIS — R3915 Urgency of urination: Secondary | ICD-10-CM

## 2016-06-18 NOTE — Patient Instructions (Addendum)
Restart home exercise program. If back pain is not improving in the next 4-6 weeks, let me know and I will refer you back to physical therapy. Just let me know the practice where you received that therapy previously. If any worsening of symptoms, return for recheck.  I will repeat testing for your elevated liver tests, avoid more than 1-2 alcoholic drinks per day.   I will recheck prostate level, but if any change in urinary symptoms, return for recheck.  Increase activity/exercise.   Keeping you healthy  Get these tests  Blood pressure- Have your blood pressure checked once a year by your healthcare provider.  Normal blood pressure is 120/80.  Weight- Have your body mass index (BMI) calculated to screen for obesity.  BMI is a measure of body fat based on height and weight. You can also calculate your own BMI at [employer]it.  Cholesterol- Have your cholesterol checked regularly starting at age 10, sooner may be necessary if you have diabetes, high blood pressure, if a family member developed heart diseases at an early age or if you smoke.   Chlamydia, HIV, and other sexual transmitted disease- Get screened each year until the age of 63 then within three months of each new sexual partner.  Diabetes- Have your blood sugar checked regularly if you have high blood pressure, high cholesterol, a family history of diabetes or if you are overweight.  Get these vaccines  Flu shot- Every fall.  Tetanus shot- Every 10 years.  Menactra- Single dose; prevents meningitis.  Take these steps  Don't smoke- If you do smoke, ask your healthcare provider about quitting. For tips on how to quit, go to www.smokefree.gov or call 1-800-QUIT-NOW.  Be physically active- Exercise 5 days a week for at least 30 minutes.  If you are not already physically active start slow and gradually work up to 30 minutes of moderate physical activity.  Examples of moderate activity include walking briskly,  mowing the yard, dancing, swimming bicycling, etc.  Eat a healthy diet- Eat a variety of healthy foods such as fruits, vegetables, low fat milk, low fat cheese, yogurt, lean meats, poultry, fish, beans, tofu, etc.  For more information on healthy eating, go to www.thenutritionsource.org  Drink alcohol in moderation- Limit alcohol intake two drinks or less a day.  Never drink and drive.  Dentist- Brush and floss teeth twice daily; visit your dentis twice a year.  Depression-Your emotional health is as important as your physical health.  If you're feeling down, losing interest in things you normally enjoy please talk with your healthcare provider.  Gun Safety- If you keep a gun in your home, keep it unloaded and with the safety lock on.  Bullets should be stored separately.  Helmet use- Always wear a helmet when riding a motorcycle, bicycle, rollerblading or skateboarding.  Safe sex- If you may be exposed to a sexually transmitted infection, use a condom  Seat belts- Seat bels can save your life; always wear one.  Smoke/Carbon Monoxide detectors- These detectors need to be installed on the appropriate level of your home.  Replace batteries at least once a year.  Skin Cancer- When out in the sun, cover up and use sunscreen SPF 15 or higher.  Violence- If anyone is threatening or hurting you, please tell your healthcare provider.  IF you received an x-ray today, you will receive an invoice from [facility]. Please contact [facility] at [phone] with questions or concerns regarding your invoice.   IF you received  labwork today, you will receive an invoice from [facility]. Please contact [facility] at [phone] with questions or concerns regarding your invoice.   Our billing staff will not be able to assist you with questions regarding bills from these companies.  You will be contacted with the lab results as soon as they are available. The fastest way to get your  results is to activate your My Chart account. Instructions are located on the last page of this paperwork. If you have not heard from [place] regarding the results in 2 weeks, please contact this office.

## 2016-06-18 NOTE — Progress Notes (Addendum)
By signing my name below, I, Kyle Swanson, attest that this documentation has been prepared under the direction and in the presence of Kyle Ray, MD.  Electronically Signed: Verlee Swanson, Medical Scribe. 06/18/16. 8:23 AM.  Subjective:    Patient ID: Kyle Swanson, male    DOB: 07/18/1965, 51 y.o.   MRN: [id number]  HPI Chief Complaint  Patient presents with  . Annual Exam    CPE    HPI Comments: Kyle Swanson is a 51 y.o. male who presents to the Primary Care at [facility] and [facility] for his complete physical. Last hysical was with me Nov 2016.  ROS Complaints: Chronic Low Back Pain: Treated by ortho and physical therapy. Pt discontinued PT since his back pain resolved and financial concerns prevented him from going. He also mentions he can't go back without a referral so he went to [facility] for relief of his sxs. His pain is "managable" and takes naproxen for relief of his sxs. Pt admits he doesn't do his recommended PT exercises as much as he should. Deneis night sweats, unexpected weight loss, or fevers. Seasonal Allergies: Pt takes allegra and flonase for relief of his sxs. Urinary Frequency/Urgency: Pt reports anxious bladder and suspects it's triggered by stress.  Elevated LFTs: Overall stable when checked in 2016. Drinks 2-3 alcoholic drinks approximately 3 days per week. Has tried coming off of alcohol the past without change in LFTs.  Situational Anxiety: Treated with xanax PRN in the past.  Cancer Screening: Colon: No family history of colon CA. Lab Results  Component Value Date   PSA 0.83 01/31/2015   PSA 1.16 10/06/2012   PSA 1.11 04/28/2012  Prostate: No family history or prostate CA. He did have 1-2 episodes of nocturia at November visit, but was drinking caffine drinks in the evening. Pt agrees to prostate CA screening today. Pt continues to have nocturia 1-2x a night. He drinks caffine 2-3x a day, but drinks water or power-aid in the  evening. Lab Results  Component Value Date   PSA 0.83 01/31/2015   PSA 1.16 10/06/2012   PSA 1.11 04/28/2012  Skin: Pt checks his skin regularly for irregularities in his moles. Denies finding new moles, or other abnormal ABCDEs found. FHx: [place] grandmother had skin CA.  Immunizations: Immunization History  Administered Date(s) Administered  . Influenza, Seasonal, Injecte, Preservative Fre 04/28/2012  . Influenza-Unspecified 12/25/2014, 01/19/2016  . Tdap 04/28/2012   Vision: Is followed by a ophthalmologist and last had an appt in Nov 2017.  Visual Acuity Screening   Right eye Left eye Both eyes  Without correction:     With correction: 20/20 20/25 20/20    Dentist: Followed by a dentist and has biannual visits.  Exercise: Pt hasn't been exercising but plans on it when it warms up. Reports SOB when exercising while he's out of shape. Denies chest pain, or tobacco usage. FHx: Dad had stent, COPD, and emphysema. Heart disease onset 51 y/o and he was a smoker.   Depression Screening: Pt is stressed every now and then but he's not depressed. Pt is overall enjoying life hangs out with his friends and plays music, takes naps. Depression screen [facility] 2/9 06/18/2016 01/31/2015 11/22/2014  Decreased Interest 0 0 0  Down, Depressed, Hopeless 0 0 0  PHQ - 2 Score 0 0 0    Patient Active Problem List   Diagnosis Date Noted  . Palpitations 01/05/2013  . Allergy   . Asthma   . Allergic rhinitis 04/28/2012  Past Medical History:  Diagnosis Date  . Allergic rhinitis 04/28/2012  . Allergy   . Anxiety   . Asthma    Past Surgical History:  Procedure Laterality Date  . GANGLION CYST EXCISION     left wrist 10 years ago  . TONSILLECTOMY AND ADENOIDECTOMY     childhood   Allergies  Allergen Reactions  . Sulfa Antibiotics Rash   Prior to Admission medications   Medication Sig Start Date End Date Taking? Authorizing Provider  fexofenadine (ALLEGRA) 60 MG tablet Take 60 mg by mouth  2 (two) times daily.   Yes Historical Provider, MD  methocarbamol (ROBAXIN-750) 750 MG tablet Take 1 tablet (750 mg total) by mouth every 8 (eight) hours as needed for muscle spasms. Patient not taking: Reported on 06/18/2016 11/22/14   Orma Flaming, MD  Triamcinolone Acetonide (NASACORT AQ NA) Place into the nose.    Historical Provider, MD   Social History   Social History  . Marital status: Single    Spouse name: N/A  . Number of children: N/A  . Years of education: N/A   Occupational History  . [employer]    Social History Main Topics  . Smoking status: Never Smoker  . Smokeless tobacco: Never Used  . Alcohol use 7.2 oz/week    12 Standard drinks or equivalent per week     Comment: BEER  . Drug use: No  . Sexual activity: Yes    Birth control/ protection: Condom, IUD     Comment: number of sex partners in the last 12 months - 4   Other Topics Concern  . Not on file   Social History Narrative   Divorced. Education: [employer]. Exercise: No.   Review of Systems  Genitourinary: Positive for frequency and urgency.  Musculoskeletal: Positive for back pain.  Allergic/Immunologic: Positive for environmental allergies.  13 point ROS positive for the above. See details above. Objective:  Physical Exam  Constitutional: He is oriented to person, place, and time. He appears well-developed and well-nourished.  HENT:  Head: Normocephalic and atraumatic.  Right Ear: External ear normal.  Left Ear: External ear normal.  Mouth/Throat: Oropharynx is clear and moist.  Eyes: Conjunctivae and EOM are normal. Pupils are equal, round, and reactive to light.  Neck: Normal range of motion. Neck supple. No thyromegaly present.  Cardiovascular: Normal rate, regular rhythm, normal heart sounds and intact distal pulses.   Pulmonary/Chest: Effort normal and breath sounds normal. No respiratory distress. He has no wheezes.  Abdominal: Soft. He exhibits no distension. There is no tenderness.  Hernia confirmed negative in the right inguinal area and confirmed negative in the left inguinal area.  Genitourinary:  Genitourinary Comments: DRE deferred today. Agreed to psa.   Musculoskeletal: Normal range of motion. He exhibits no edema or tenderness.  Low back FROM  Lymphadenopathy:    He has no cervical adenopathy.  Neurological: He is alert and oriented to person, place, and time. He has normal reflexes.  Skin: Skin is warm and dry.  Psychiatric: He has a normal mood and affect. His behavior is normal.  Vitals reviewed.   Vitals:   06/18/16 0807 06/18/16 0854  BP: (!) 141/92 137/88  Pulse: 79   Temp: 97.9 F (36.6 C)   TempSrc: Oral   SpO2: 95%   Weight: 212 lb 3.2 oz (96.3 kg)   Height: 6' 1.5" (1.867 m)   Body mass index is 27.62 kg/m. Assessment & Plan:  Samyak Sackmann is a 51 y.o.  male Annual physical exam  - -anticipatory guidance as below in AVS, screening labs above. Health maintenance items as above in HPI discussed/recommended as applicable.   Elevated LFTs - Plan: Comprehensive metabolic panel  - borderline in past, possibly due to fatty liver. Recheck LFTs today, avoid heavy use of alcohol.  Chronic low back pain without sciatica, unspecified back pain laterality  - restart home exercise program. If not improved within 4-6 weeks, can refer back to physical therapy. Episodic Aleve if needed. rtc precautions.   Allergic rhinitis, unspecified chronicity, unspecified seasonality, unspecified trigger  - Stable. Continue over-the-counter antihistamine and steroid nasal spray  Urinary urgency - Plan: Comprehensive metabolic panel, PSA  - Possible stress/anxiety component. Repeat PSA. RTC precautions if any increased/change in symptoms  Screening for diabetes mellitus - Plan: Comprehensive metabolic panel  Screening for hyperlipidemia - Plan: Lipid panel   No orders of the defined types were placed in this encounter.  Patient Instructions   Restart home  exercise program. If back pain is not improving in the next 4-6 weeks, let me know and I will refer you back to physical therapy. Just let me know the practice where you received that therapy previously. If any worsening of symptoms, return for recheck.  I will repeat testing for your elevated liver tests, avoid more than 1-2 alcoholic drinks per day.   I will recheck prostate level, but if any change in urinary symptoms, return for recheck.  Increase activity/exercise.   Keeping you healthy  Get these tests  Blood pressure- Have your blood pressure checked once a year by your healthcare provider.  Normal blood pressure is 120/80.  Weight- Have your body mass index (BMI) calculated to screen for obesity.  BMI is a measure of body fat based on height and weight. You can also calculate your own BMI at [employer]velBags.it.  Cholesterol- Have your cholesterol checked regularly starting at age 67, sooner may be necessary if you have diabetes, high blood pressure, if a family member developed heart diseases at an early age or if you smoke.   Chlamydia, HIV, and other sexual transmitted disease- Get screened each year until the age of 62 then within three months of each new sexual partner.  Diabetes- Have your blood sugar checked regularly if you have high blood pressure, high cholesterol, a family history of diabetes or if you are overweight.  Get these vaccines  Flu shot- Every fall.  Tetanus shot- Every 10 years.  Menactra- Single dose; prevents meningitis.  Take these steps  Don't smoke- If you do smoke, ask your healthcare provider about quitting. For tips on how to quit, go to www.smokefree.gov or call 1-800-QUIT-NOW.  Be physically active- Exercise 5 days a week for at least 30 minutes.  If you are not already physically active start slow and gradually work up to 30 minutes of moderate physical activity.  Examples of moderate activity include walking briskly, mowing the yard,  dancing, swimming bicycling, etc.  Eat a healthy diet- Eat a variety of healthy foods such as fruits, vegetables, low fat milk, low fat cheese, yogurt, lean meats, poultry, fish, beans, tofu, etc.  For more information on healthy eating, go to www.thenutritionsource.org  Drink alcohol in moderation- Limit alcohol intake two drinks or less a day.  Never drink and drive.  Dentist- Brush and floss teeth twice daily; visit your dentis twice a year.  Depression-Your emotional health is as important as your physical health.  If you're feeling down, losing interest in things  you normally enjoy please talk with your healthcare provider.  Gun Safety- If you keep a gun in your home, keep it unloaded and with the safety lock on.  Bullets should be stored separately.  Helmet use- Always wear a helmet when riding a motorcycle, bicycle, rollerblading or skateboarding.  Safe sex- If you may be exposed to a sexually transmitted infection, use a condom  Seat belts- Seat bels can save your life; always wear one.  Smoke/Carbon Monoxide detectors- These detectors need to be installed on the appropriate level of your home.  Replace batteries at least once a year.  Skin Cancer- When out in the sun, cover up and use sunscreen SPF 15 or higher.  Violence- If anyone is threatening or hurting you, please tell your healthcare provider.  IF you received an x-Swanson today, you will receive an invoice from [facility]. Please contact [facility] at [phone] with questions or concerns regarding your invoice.   IF you received labwork today, you will receive an invoice from [place]. Please contact [facility] at [phone] with questions or concerns regarding your invoice.   Our billing staff will not be able to assist you with questions regarding bills from these companies.  You will be contacted with the lab results as soon as they are available. The fastest way to get your results is to activate  your My Chart account. Instructions are located on the last page of this paperwork. If you have not heard from [place] regarding the results in 2 weeks, please contact this office.       I personally performed the services described in this documentation, which was scribed in my presence. The recorded information has been reviewed and considered for accuracy and completeness, addended by me as needed, and agree with information above.  Signed,   Kyle Ray, MD Primary Care at [place].  06/18/16 8:52 AM

## 2016-06-19 LAB — COMPREHENSIVE METABOLIC PANEL
ALT: 66 IU/L — ABNORMAL HIGH (ref 0–44)
AST: 34 IU/L (ref 0–40)
Albumin/Globulin Ratio: 2.1 (ref 1.2–2.2)
Albumin: 4.7 g/dL (ref 3.5–5.5)
Alkaline Phosphatase: 72 IU/L (ref 39–117)
BUN/Creatinine Ratio: 21 — ABNORMAL HIGH (ref 9–20)
BUN: 22 mg/dL (ref 6–24)
Bilirubin Total: 0.5 mg/dL (ref 0.0–1.2)
CO2: 28 mmol/L (ref 18–29)
CREATININE: 1.06 mg/dL (ref 0.76–1.27)
Calcium: 9.2 mg/dL (ref 8.7–10.2)
Chloride: 101 mmol/L (ref 96–106)
GFR calc Af Amer: 95 mL/min/{1.73_m2} (ref >59)
GFR, EST NON AFRICAN AMERICAN: 82 mL/min/{1.73_m2} (ref >59)
GLOBULIN, TOTAL: 2.2 g/dL (ref 1.5–4.5)
Glucose: 97 mg/dL (ref 65–99)
Potassium: 4.2 mmol/L (ref 3.5–5.2)
SODIUM: 146 mmol/L — AB (ref 134–144)
TOTAL PROTEIN: 6.9 g/dL (ref 6.0–8.5)

## 2016-06-19 LAB — LIPID PANEL
CHOL/HDL RATIO: 3.3 ratio (ref 0.0–5.0)
Cholesterol, Total: 211 mg/dL — ABNORMAL HIGH (ref 100–199)
HDL: 63 mg/dL (ref >39)
LDL CALC: 129 mg/dL — AB (ref 0–99)
Triglycerides: 96 mg/dL (ref 0–149)
VLDL CHOLESTEROL CAL: 19 mg/dL (ref 5–40)

## 2016-06-19 LAB — PSA: Prostate Specific Ag, Serum: 0.9 ng/mL (ref 0.0–4.0)

## 2016-06-20 ENCOUNTER — Other Ambulatory Visit: Payer: Self-pay | Admitting: Family Medicine

## 2016-06-20 DIAGNOSIS — E870 Hyperosmolality and hypernatremia: Secondary | ICD-10-CM

## 2016-08-01 ENCOUNTER — Encounter: Payer: Self-pay | Admitting: Emergency Medicine

## 2016-08-01 ENCOUNTER — Ambulatory Visit (INDEPENDENT_AMBULATORY_CARE_PROVIDER_SITE_OTHER): Payer: 59 | Admitting: Emergency Medicine

## 2016-08-01 VITALS — BP 126/91 | HR 111 | Temp 98.8°F | Resp 17 | Ht 74.5 in | Wt 210.0 lb

## 2016-08-01 DIAGNOSIS — R05 Cough: Secondary | ICD-10-CM

## 2016-08-01 DIAGNOSIS — R5383 Other fatigue: Secondary | ICD-10-CM

## 2016-08-01 DIAGNOSIS — R059 Cough, unspecified: Secondary | ICD-10-CM

## 2016-08-01 DIAGNOSIS — R5381 Other malaise: Secondary | ICD-10-CM

## 2016-08-01 DIAGNOSIS — R6889 Other general symptoms and signs: Secondary | ICD-10-CM

## 2016-08-01 DIAGNOSIS — B349 Viral infection, unspecified: Secondary | ICD-10-CM

## 2016-08-01 LAB — POC INFLUENZA A&B (BINAX/QUICKVUE)
INFLUENZA B, POC: NEGATIVE
Influenza A, POC: NEGATIVE

## 2016-08-01 MED ORDER — PREDNISONE 20 MG PO TABS: 40.0000 mg | ORAL_TABLET | Freq: Every day | ORAL | 0 refills | Status: AC

## 2016-08-01 MED ORDER — HYDROCODONE-HOMATROPINE 5-1.5 MG/5ML PO SYRP: 5.0000 mL | ORAL_SOLUTION | Freq: Three times a day (TID) | ORAL | 0 refills | Status: DC | PRN

## 2016-08-01 NOTE — Progress Notes (Signed)
Kyle Swanson 51 y.o.   Chief Complaint  Patient presents with  . Fatigue  . Cough  . Dizziness    HISTORY OF PRESENT ILLNESS: This is a 51 y.o. male complaining of 2-3 day h/o dry cough, fatigue, general malaise, nausea, dizziness, and generalized body aches.  HPI   Prior to Admission medications   Medication Sig Start Date End Date Taking? Authorizing Provider  cholecalciferol (VITAMIN D) 1000 units tablet Take 1,000 Units by mouth daily.   Yes [provider]  fexofenadine (ALLEGRA) 60 MG tablet Take 60 mg by mouth 2 (two) times daily.   Yes [provider]  Triamcinolone Acetonide (NASACORT AQ NA) Place into the nose.   Yes [provider]  vitamin B-12 (CYANOCOBALAMIN) 100 MCG tablet Take 100 mcg by mouth daily.   Yes [provider]    Allergies  Allergen Reactions  . Sulfa Antibiotics Rash    Patient Active Problem List   Diagnosis Date Noted  . Palpitations 01/05/2013  . Allergy   . Asthma   . Allergic rhinitis 04/28/2012    Past Medical History:  Diagnosis Date  . Allergic rhinitis 04/28/2012  . Allergy   . Anxiety   . Asthma     Past Surgical History:  Procedure Laterality Date  . GANGLION CYST EXCISION     left wrist 10 years ago  . TONSILLECTOMY AND ADENOIDECTOMY     childhood    Social History   Social History  . Marital status: Single    Spouse name: N/A  . Number of children: N/A  . Years of education: N/A   Occupational History  . [employer]    Social History Main Topics  . Smoking status: Never Smoker  . Smokeless tobacco: Never Used  . Alcohol use 7.2 oz/week    12 Standard drinks or equivalent per week     Comment: BEER  . Drug use: No  . Sexual activity: Yes    Birth control/ protection: Condom, IUD     Comment: number of sex partners in the last 59 months - 4   Other Topics Concern  . Not on file   Social History Narrative   Divorced. Education: [employer]. Exercise: No.     Family History  Problem Relation Age of Onset  . COPD Father     emphysema  . Heart disease Father   . Cancer Maternal Grandmother     skin  . Cancer Paternal Grandmother   . Diabetes Paternal Grandfather      Review of Systems  Constitutional: Positive for malaise/fatigue. Negative for chills and fever.  HENT: Positive for congestion and ear pain (fullness). Negative for nosebleeds and sore throat.   Eyes: Negative.  Negative for discharge and redness.  Respiratory: Positive for cough. Negative for hemoptysis, sputum production and shortness of breath.   Cardiovascular: Negative.  Negative for chest pain, palpitations and leg swelling.  Gastrointestinal: Positive for nausea. Negative for abdominal pain, diarrhea and vomiting.  Genitourinary: Negative.   Musculoskeletal: Positive for myalgias. Negative for back pain and neck pain.  Skin: Negative for rash.  Neurological: Positive for dizziness and weakness. Negative for sensory change, focal weakness and headaches.  Endo/Heme/Allergies: Negative.   All other systems reviewed and are negative.  Vitals:   08/01/16 1407  BP: (!) 126/91  Pulse: (!) 111  Resp: 17  Temp: 98.8 F (37.1 C)     Physical Exam  Constitutional: He is oriented to person, place, and time. He  appears well-developed and well-nourished.  HENT:  Head: Normocephalic and atraumatic.  Right Ear: Tympanic membrane and external ear normal.  Left Ear: Tympanic membrane and external ear normal.  Nose: Nose normal.  Mouth/Throat: Oropharynx is clear and moist. No oropharyngeal exudate.  Eyes: Conjunctivae and EOM are normal. Pupils are equal, round, and reactive to light.  Neck: Normal range of motion. Neck supple. No JVD present. No thyromegaly present.  Cardiovascular: Normal rate, regular rhythm, normal heart sounds and intact distal pulses.   Pulmonary/Chest: Effort normal and breath sounds normal.  Abdominal: Soft. Bowel sounds are normal. He  exhibits no distension. There is no tenderness.  Musculoskeletal: Normal range of motion.  Lymphadenopathy:    He has no cervical adenopathy.  Neurological: He is alert and oriented to person, place, and time. No sensory deficit. He exhibits normal muscle tone.  Skin: Skin is warm and dry. Capillary refill takes less than 2 seconds. No rash noted.  Psychiatric: He has a normal mood and affect. His behavior is normal.  Vitals reviewed.  Results for orders placed or performed in visit on 08/01/16 (from the past 24 hour(s))  POC Influenza A&B(BINAX/QUICKVUE)     Status: None   Collection Time: 08/01/16  2:49 PM  Result Value Ref Range   Influenza A, POC Negative Negative   Influenza B, POC Negative Negative     ASSESSMENT & PLAN: Kyle Swanson was seen today for fatigue, cough and dizziness.  Diagnoses and all orders for this visit:  Viral illness  Flu-like symptoms -     POC Influenza A&B(BINAX/QUICKVUE)  Cough  Malaise and fatigue  Other orders -     HYDROcodone-homatropine (HYCODAN) 5-1.5 MG/5ML syrup; Take 5 mLs by mouth every 8 (eight) hours as needed for cough. -     predniSONE (DELTASONE) 20 MG tablet; Take 2 tablets (40 mg total) by mouth daily with breakfast.    Patient Instructions       IF you received an x-ray today, you will receive an invoice from [facility]. Please contact [facility] at [phone] with questions or concerns regarding your invoice.   IF you received labwork today, you will receive an invoice from [place]. Please contact [facility] at [phone] with questions or concerns regarding your invoice.   Our billing staff will not be able to assist you with questions regarding bills from these companies.  You will be contacted with the lab results as soon as they are available. The fastest way to get your results is to activate your My Chart account. Instructions are located on the last page of this paperwork. If you have not  heard from [place] regarding the results in 2 weeks, please contact this office.      Viral Illness, Adult Viruses are tiny germs that can get into a person's body and cause illness. There are many different types of viruses, and they cause many types of illness. Viral illnesses can range from mild to severe. They can affect various parts of the body. Common illnesses that are caused by a virus include colds and the flu. Viral illnesses also include serious conditions such as HIV/AIDS (human immunodeficiency virus/acquired immunodeficiency syndrome). A few viruses have been linked to certain cancers. What are the causes? Many types of viruses can cause illness. Viruses invade cells in your body, multiply, and cause the infected cells to malfunction or die. When the cell dies, it releases more of the virus. When this happens, you develop symptoms of the illness, and the virus  continues to spread to other cells. If the virus takes over the function of the cell, it can cause the cell to divide and grow out of control, as is the case when a virus causes cancer. Different viruses get into the body in different ways. You can get a virus by:  Swallowing food or water that is contaminated with the virus.  Breathing in droplets that have been coughed or sneezed into the air by an infected person.  Touching a surface that has been contaminated with the virus and then touching your eyes, nose, or mouth.  Being bitten by an insect or animal that carries the virus.  Having sexual contact with a person who is infected with the virus.  Being exposed to blood or fluids that contain the virus, either through an open cut or during a transfusion. If a virus enters your body, your body's defense system (immune system) will try to fight the virus. You may be at higher risk for a viral illness if your immune system is weak. What are the signs or symptoms? Symptoms vary depending on the type of virus and the location  of the cells that it invades. Common symptoms of the main types of viral illnesses include: Cold and flu viruses   Fever.  Headache.  Sore throat.  Muscle aches.  Nasal congestion.  Cough. Digestive system (gastrointestinal) viruses   Fever.  Abdominal pain.  Nausea.  Diarrhea. Liver viruses (hepatitis)   Loss of appetite.  Tiredness.  Yellowing of the skin (jaundice). Brain and spinal cord viruses   Fever.  Headache.  Stiff neck.  Nausea and vomiting.  Confusion or sleepiness. Skin viruses   Warts.  Itching.  Rash. Sexually transmitted viruses   Discharge.  Swelling.  Redness.  Rash. How is this treated? Viruses can be difficult to treat because they live within cells. Antibiotic medicines do not treat viruses because these drugs do not get inside cells. Treatment for a viral illness may include:  Resting and drinking plenty of fluids.  Medicines to relieve symptoms. These can include over-the-counter medicine for pain and fever, medicines for cough or congestion, and medicines to relieve diarrhea.  Antiviral medicines. These drugs are available only for certain types of viruses. They may help reduce flu symptoms if taken early. There are also many antiviral medicines for hepatitis and HIV/AIDS. Some viral illnesses can be prevented with vaccinations. A common example is the flu shot. Follow these instructions at home: Medicines    Take over-the-counter and prescription medicines only as told by your health care provider.  If you were prescribed an antiviral medicine, take it as told by your health care provider. Do not stop taking the medicine even if you start to feel better.  Be aware of when antibiotics are needed and when they are not needed. Antibiotics do not treat viruses. If your health care provider thinks that you may have a bacterial infection as well as a viral infection, you may get an antibiotic.  Do not ask for an antibiotic  prescription if you have been diagnosed with a viral illness. That will not make your illness go away faster.  Frequently taking antibiotics when they are not needed can lead to antibiotic resistance. When this develops, the medicine no longer works against the bacteria that it normally fights. General instructions   Drink enough fluids to keep your urine clear or pale yellow.  Rest as much as possible.  Return to your normal activities as told by your  health care provider. Ask your health care provider what activities are safe for you.  Keep all follow-up visits as told by your health care provider. This is important. How is this prevented? Take these actions to reduce your risk of viral infection:  Eat a healthy diet and get enough rest.  Wash your hands often with soap and water. This is especially important when you are in public places. If soap and water are not available, use hand sanitizer.  Avoid close contact with friends and family who have a viral illness.  If you travel to areas where viral gastrointestinal infection is common, avoid drinking water or eating raw food.  Keep your immunizations up to date. Get a flu shot every year as told by your health care provider.  Do not share toothbrushes, nail clippers, razors, or needles with other people.  Always practice safe sex. Contact a health care provider if:  You have symptoms of a viral illness that do not go away.  Your symptoms come back after going away.  Your symptoms get worse. Get help right away if:  You have trouble breathing.  You have a severe headache or a stiff neck.  You have severe vomiting or abdominal pain. This information is not intended to replace advice given to you by your health care provider. Make sure you discuss any questions you have with your health care provider. Document Released: 07/22/2015 Document Revised: 08/24/2015 Document Reviewed: 07/22/2015 Elsevier Interactive Patient  Education  2017 [employer]      Agustina Caroli, MD [facility]

## 2016-08-01 NOTE — Patient Instructions (Addendum)
   IF you received an x-ray today, you will receive an invoice from [facility]. Please contact [facility] at [phone] with questions or concerns regarding your invoice.   IF you received labwork today, you will receive an invoice from [facility]. Please contact [facility] at [phone] with questions or concerns regarding your invoice.   Our billing staff will not be able to assist you with questions regarding bills from these companies.  You will be contacted with the lab results as soon as they are available. The fastest way to get your results is to activate your My Chart account. Instructions are located on the last page of this paperwork. If you have not heard from us regarding the results in 2 weeks, please contact this office.     Viral Illness, Adult Viruses are tiny germs that can get into a person's body and cause illness. There are many different types of viruses, and they cause many types of illness. Viral illnesses can range from mild to severe. They can affect various parts of the body. Common illnesses that are caused by a virus include colds and the flu. Viral illnesses also include serious conditions such as HIV/AIDS (human immunodeficiency virus/acquired immunodeficiency syndrome). A few viruses have been linked to certain cancers. What are the causes? Many types of viruses can cause illness. Viruses invade cells in your body, multiply, and cause the infected cells to malfunction or die. When the cell dies, it releases more of the virus. When this happens, you develop symptoms of the illness, and the virus continues to spread to other cells. If the virus takes over the function of the cell, it can cause the cell to divide and grow out of control, as is the case when a virus causes cancer. Different viruses get into the body in different ways. You can get a virus by:  Swallowing food or water that is contaminated with the virus.  Breathing in droplets  that have been coughed or sneezed into the air by an infected person.  Touching a surface that has been contaminated with the virus and then touching your eyes, nose, or mouth.  Being bitten by an insect or animal that carries the virus.  Having sexual contact with a person who is infected with the virus.  Being exposed to blood or fluids that contain the virus, either through an open cut or during a transfusion. If a virus enters your body, your body's defense system (immune system) will try to fight the virus. You may be at higher risk for a viral illness if your immune system is weak. What are the signs or symptoms? Symptoms vary depending on the type of virus and the location of the cells that it invades. Common symptoms of the main types of viral illnesses include: Cold and flu viruses   Fever.  Headache.  Sore throat.  Muscle aches.  Nasal congestion.  Cough. Digestive system (gastrointestinal) viruses   Fever.  Abdominal pain.  Nausea.  Diarrhea. Liver viruses (hepatitis)   Loss of appetite.  Tiredness.  Yellowing of the skin (jaundice). Brain and spinal cord viruses   Fever.  Headache.  Stiff neck.  Nausea and vomiting.  Confusion or sleepiness. Skin viruses   Warts.  Itching.  Rash. Sexually transmitted viruses   Discharge.  Swelling.  Redness.  Rash. How is this treated? Viruses can be difficult to treat because they live within cells. Antibiotic medicines do not treat viruses because these drugs do not get inside cells.   Treatment for a viral illness may include:  Resting and drinking plenty of fluids.  Medicines to relieve symptoms. These can include over-the-counter medicine for pain and fever, medicines for cough or congestion, and medicines to relieve diarrhea.  Antiviral medicines. These drugs are available only for certain types of viruses. They may help reduce flu symptoms if taken early. There are also many antiviral  medicines for hepatitis and HIV/AIDS. Some viral illnesses can be prevented with vaccinations. A common example is the flu shot. Follow these instructions at home: Medicines    Take over-the-counter and prescription medicines only as told by your health care provider.  If you were prescribed an antiviral medicine, take it as told by your health care provider. Do not stop taking the medicine even if you start to feel better.  Be aware of when antibiotics are needed and when they are not needed. Antibiotics do not treat viruses. If your health care provider thinks that you may have a bacterial infection as well as a viral infection, you may get an antibiotic.  Do not ask for an antibiotic prescription if you have been diagnosed with a viral illness. That will not make your illness go away faster.  Frequently taking antibiotics when they are not needed can lead to antibiotic resistance. When this develops, the medicine no longer works against the bacteria that it normally fights. General instructions   Drink enough fluids to keep your urine clear or pale yellow.  Rest as much as possible.  Return to your normal activities as told by your health care provider. Ask your health care provider what activities are safe for you.  Keep all follow-up visits as told by your health care provider. This is important. How is this prevented? Take these actions to reduce your risk of viral infection:  Eat a healthy diet and get enough rest.  Wash your hands often with soap and water. This is especially important when you are in public places. If soap and water are not available, use hand sanitizer.  Avoid close contact with friends and family who have a viral illness.  If you travel to areas where viral gastrointestinal infection is common, avoid drinking water or eating raw food.  Keep your immunizations up to date. Get a flu shot every year as told by your health care provider.  Do not share  toothbrushes, nail clippers, razors, or needles with other people.  Always practice safe sex. Contact a health care provider if:  You have symptoms of a viral illness that do not go away.  Your symptoms come back after going away.  Your symptoms get worse. Get help right away if:  You have trouble breathing.  You have a severe headache or a stiff neck.  You have severe vomiting or abdominal pain. This information is not intended to replace advice given to you by your health care provider. Make sure you discuss any questions you have with your health care provider. Document Released: 07/22/2015 Document Revised: 08/24/2015 Document Reviewed: 07/22/2015 Elsevier Interactive Patient Education  2017 Elsevier Inc.   

## 2016-08-09 ENCOUNTER — Ambulatory Visit (INDEPENDENT_AMBULATORY_CARE_PROVIDER_SITE_OTHER): Payer: 59 | Admitting: Emergency Medicine

## 2016-08-09 ENCOUNTER — Ambulatory Visit (INDEPENDENT_AMBULATORY_CARE_PROVIDER_SITE_OTHER): Payer: 59

## 2016-08-09 ENCOUNTER — Encounter: Payer: Self-pay | Admitting: Emergency Medicine

## 2016-08-09 VITALS — BP 126/84 | HR 109 | Temp 98.5°F | Resp 18 | Ht 74.5 in | Wt 209.2 lb

## 2016-08-09 DIAGNOSIS — R05 Cough: Secondary | ICD-10-CM

## 2016-08-09 DIAGNOSIS — B349 Viral infection, unspecified: Secondary | ICD-10-CM

## 2016-08-09 DIAGNOSIS — R059 Cough, unspecified: Secondary | ICD-10-CM

## 2016-08-09 DIAGNOSIS — R531 Weakness: Secondary | ICD-10-CM

## 2016-08-09 LAB — POCT CBC
GRANULOCYTE PERCENT: 64.1 % (ref 37–80)
HCT, POC: 47.4 % (ref 43.5–53.7)
HEMOGLOBIN: 16.5 g/dL (ref 14.1–18.1)
LYMPH, POC: 2.5 (ref 0.6–3.4)
MCH, POC: 30.9 pg (ref 27–31.2)
MCHC: 34.9 g/dL (ref 31.8–35.4)
MCV: 88.4 fL (ref 80–97)
MID (cbc): 0.5 (ref 0–0.9)
MPV: 6.9 fL (ref 0–99.8)
PLATELET COUNT, POC: 265 10*3/uL (ref 142–424)
POC Granulocyte: 5.4 (ref 2–6.9)
POC LYMPH %: 29.6 % (ref 10–50)
POC MID %: 6.3 %M (ref 0–12)
RBC: 5.35 M/uL (ref 4.69–6.13)
RDW, POC: 13 %
WBC: 8.4 10*3/uL (ref 4.6–10.2)

## 2016-08-09 MED ORDER — HYDROCODONE-HOMATROPINE 5-1.5 MG/5ML PO SYRP: 5.0000 mL | ORAL_SOLUTION | Freq: Three times a day (TID) | ORAL | 0 refills | Status: DC | PRN

## 2016-08-09 NOTE — Progress Notes (Signed)
Kyle Swanson 51 y.o.   Chief Complaint  Patient presents with  . Follow-up    Viral illness pt says cough, fatigue, and nasal congestion is still there     HISTORY OF PRESENT ILLNESS: This is a 51 y.o. male seen by me 08/01/16 for viral illness; generalized achiness gone; malaise still present; cough waxes and wanes but not productive; 10 days sick; no new symptoms; fatigue/malaise still main symptom.  HPI   Prior to Admission medications   Medication Sig Start Date End Date Taking? Authorizing Provider  cholecalciferol (VITAMIN D) 1000 units tablet Take 1,000 Units by mouth daily.   Yes [provider]  fexofenadine (ALLEGRA) 60 MG tablet Take 60 mg by mouth 2 (two) times daily.   Yes [provider]  Triamcinolone Acetonide (NASACORT AQ NA) Place into the nose.   Yes [provider]  vitamin B-12 (CYANOCOBALAMIN) 100 MCG tablet Take 100 mcg by mouth daily.   Yes [provider]  HYDROcodone-homatropine (HYCODAN) 5-1.5 MG/5ML syrup Take 5 mLs by mouth every 8 (eight) hours as needed for cough. Patient not taking: Reported on 08/09/2016 08/01/16   Horald Pollen, MD    Allergies  Allergen Reactions  . Sulfa Antibiotics Rash    Patient Active Problem List   Diagnosis Date Noted  . Palpitations 01/05/2013  . Allergy   . Asthma   . Allergic rhinitis 04/28/2012    Past Medical History:  Diagnosis Date  . Allergic rhinitis 04/28/2012  . Allergy   . Anxiety   . Asthma     Past Surgical History:  Procedure Laterality Date  . GANGLION CYST EXCISION     left wrist 10 years ago  . TONSILLECTOMY AND ADENOIDECTOMY     childhood    Social History   Social History  . Marital status: Single    Spouse name: N/A  . Number of children: N/A  . Years of education: N/A   Occupational History  . [employer]    Social History Main Topics  . Smoking status: Never Smoker  . Smokeless tobacco: Never Used  . Alcohol use 7.2 oz/week     12 Standard drinks or equivalent per week     Comment: BEER  . Drug use: No  . Sexual activity: Yes    Birth control/ protection: Condom, IUD     Comment: number of sex partners in the last 68 months - 4   Other Topics Concern  . Not on file   Social History Narrative   Divorced. Education: [employer]. Exercise: No.    Family History  Problem Relation Age of Onset  . COPD Father        emphysema  . Heart disease Father   . Cancer Maternal Grandmother        skin  . Cancer Paternal Grandmother   . Diabetes Paternal Grandfather      Review of Systems  Constitutional: Positive for chills and malaise/fatigue. Negative for fever.  HENT: Negative for congestion, ear discharge, ear pain, nosebleeds, sinus pain and sore throat.   Eyes: Negative for blurred vision, double vision, discharge and redness.  Respiratory: Positive for cough and wheezing. Negative for sputum production and shortness of breath.   Cardiovascular: Negative for chest pain, palpitations and leg swelling.  Gastrointestinal: Negative for abdominal pain, blood in stool, constipation, diarrhea, nausea and vomiting.  Genitourinary: Negative for dysuria, flank pain, frequency, hematuria and urgency.  Musculoskeletal: Negative for back pain, joint pain, myalgias and neck pain.  Skin: Negative for rash.  Neurological: Positive for weakness. Negative for dizziness, sensory change, speech change, focal weakness and headaches.  Endo/Heme/Allergies: Negative.   All other systems reviewed and are negative.   Vitals:   08/09/16 1525  BP: 126/84  Pulse: (!) 109  Resp: 18  Temp: 98.5 F (36.9 C)    Physical Exam  Constitutional: He is oriented to person, place, and time. He appears well-developed and well-nourished.  HENT:  Head: Normocephalic and atraumatic.  Right Ear: External ear normal.  Left Ear: External ear normal.  Nose: Nose normal.  Mouth/Throat: Oropharynx is clear and moist. No oropharyngeal  exudate.  Eyes: Conjunctivae and EOM are normal. Pupils are equal, round, and reactive to light.  Neck: Normal range of motion. Neck supple. No JVD present. No thyromegaly present.  Cardiovascular: Normal rate, regular rhythm, normal heart sounds and intact distal pulses.   Pulmonary/Chest: Effort normal. He has no wheezes. He has rhonchi in the right lower field.  Abdominal: Soft. Bowel sounds are normal. He exhibits no distension. There is no tenderness.  Musculoskeletal: Normal range of motion.  Lymphadenopathy:    He has no cervical adenopathy.  Neurological: He is alert and oriented to person, place, and time. No sensory deficit. He exhibits normal muscle tone.  Skin: Skin is warm and dry. Capillary refill takes less than 2 seconds. No rash noted.  Psychiatric: He has a normal mood and affect. His behavior is normal.  Vitals reviewed.  Dg Chest 2 View  Result Date: 08/09/2016 CLINICAL DATA:  Cough and malaise EXAM: CHEST  2 VIEW COMPARISON:  None in PACs FINDINGS: The lungs are well-expanded. There is no focal infiltrate. There is no pleural effusion. The heart and pulmonary vascularity are normal. The mediastinum is normal in width. There is calcification in the wall of the aortic arch. The bony thorax exhibits no acute abnormality. IMPRESSION: There is no pneumonia, CHF, nor other acute cardiopulmonary abnormality. Thoracic aortic atherosclerosis. Electronically Signed   By: David  Martinique M.D.   On: 08/09/2016 16:08   Results for orders placed or performed in visit on 08/09/16 (from the past 24 hour(s))  POCT CBC     Status: None   Collection Time: 08/09/16  4:10 PM  Result Value Ref Range   WBC 8.4 4.6 - 10.2 K/uL   Lymph, poc 2.5 0.6 - 3.4   POC LYMPH PERCENT 29.6 10 - 50 %L   MID (cbc) 0.5 0 - 0.9   POC MID % 6.3 0 - 12 %M   POC Granulocyte 5.4 2 - 6.9   Granulocyte percent 64.1 37 - 80 %G   RBC 5.35 4.69 - 6.13 M/uL   Hemoglobin 16.5 14.1 - 18.1 g/dL   HCT, POC 47.4 43.5 -  53.7 %   MCV 88.4 80 - 97 fL   MCH, POC 30.9 27 - 31.2 pg   MCHC 34.9 31.8 - 35.4 g/dL   RDW, POC 13.0 %   Platelet Count, POC 265 142 - 424 K/uL   MPV 6.9 0 - 99.8 fL     ASSESSMENT & PLAN:  Kyle Swanson was seen today for follow-up.  Diagnoses and all orders for this visit:  General weakness -     Comprehensive metabolic panel -     POCT CBC -     DG Chest 2 View; Future  Viral illness -     Comprehensive metabolic panel -     POCT CBC -     DG Chest  2 View; Future  Cough -     HYDROcodone-homatropine (HYCODAN) 5-1.5 MG/5ML syrup; Take 5 mLs by mouth every 8 (eight) hours as needed for cough.    Patient Instructions       IF you received an x-ray today, you will receive an invoice from [facility]. Please contact [facility] at [phone] with questions or concerns regarding your invoice.   IF you received labwork today, you will receive an invoice from [facility]. Please contact [facility] at [phone] with questions or concerns regarding your invoice.   Our billing staff will not be able to assist you with questions regarding bills from these companies.  You will be contacted with the lab results as soon as they are available. The fastest way to get your results is to activate your My Chart account. Instructions are located on the last page of this paperwork. If you have not heard from [place] regarding the results in 2 weeks, please contact this office.      Viral Illness, Adult Viruses are tiny germs that can get into a person's body and cause illness. There are many different types of viruses, and they cause many types of illness. Viral illnesses can range from mild to severe. They can affect various parts of the body. Common illnesses that are caused by a virus include colds and the flu. Viral illnesses also include serious conditions such as HIV/AIDS (human immunodeficiency virus/acquired immunodeficiency syndrome). A few viruses have been linked to  certain cancers. What are the causes? Many types of viruses can cause illness. Viruses invade cells in your body, multiply, and cause the infected cells to malfunction or die. When the cell dies, it releases more of the virus. When this happens, you develop symptoms of the illness, and the virus continues to spread to other cells. If the virus takes over the function of the cell, it can cause the cell to divide and grow out of control, as is the case when a virus causes cancer. Different viruses get into the body in different ways. You can get a virus by:  Swallowing food or water that is contaminated with the virus.  Breathing in droplets that have been coughed or sneezed into the air by an infected person.  Touching a surface that has been contaminated with the virus and then touching your eyes, nose, or mouth.  Being bitten by an insect or animal that carries the virus.  Having sexual contact with a person who is infected with the virus.  Being exposed to blood or fluids that contain the virus, either through an open cut or during a transfusion. If a virus enters your body, your body's defense system (immune system) will try to fight the virus. You may be at higher risk for a viral illness if your immune system is weak. What are the signs or symptoms? Symptoms vary depending on the type of virus and the location of the cells that it invades. Common symptoms of the main types of viral illnesses include: Cold and flu viruses   Fever.  Headache.  Sore throat.  Muscle aches.  Nasal congestion.  Cough. Digestive system (gastrointestinal) viruses   Fever.  Abdominal pain.  Nausea.  Diarrhea. Liver viruses (hepatitis)   Loss of appetite.  Tiredness.  Yellowing of the skin (jaundice). Brain and spinal cord viruses   Fever.  Headache.  Stiff neck.  Nausea and vomiting.  Confusion or sleepiness. Skin viruses   Warts.  Itching.  Rash. Sexually transmitted  viruses  Discharge.  Swelling.  Redness.  Rash. How is this treated? Viruses can be difficult to treat because they live within cells. Antibiotic medicines do not treat viruses because these drugs do not get inside cells. Treatment for a viral illness may include:  Resting and drinking plenty of fluids.  Medicines to relieve symptoms. These can include over-the-counter medicine for pain and fever, medicines for cough or congestion, and medicines to relieve diarrhea.  Antiviral medicines. These drugs are available only for certain types of viruses. They may help reduce flu symptoms if taken early. There are also many antiviral medicines for hepatitis and HIV/AIDS. Some viral illnesses can be prevented with vaccinations. A common example is the flu shot. Follow these instructions at home: Medicines    Take over-the-counter and prescription medicines only as told by your health care provider.  If you were prescribed an antiviral medicine, take it as told by your health care provider. Do not stop taking the medicine even if you start to feel better.  Be aware of when antibiotics are needed and when they are not needed. Antibiotics do not treat viruses. If your health care provider thinks that you may have a bacterial infection as well as a viral infection, you may get an antibiotic.  Do not ask for an antibiotic prescription if you have been diagnosed with a viral illness. That will not make your illness go away faster.  Frequently taking antibiotics when they are not needed can lead to antibiotic resistance. When this develops, the medicine no longer works against the bacteria that it normally fights. General instructions   Drink enough fluids to keep your urine clear or pale yellow.  Rest as much as possible.  Return to your normal activities as told by your health care provider. Ask your health care provider what activities are safe for you.  Keep all follow-up visits as told  by your health care provider. This is important. How is this prevented? Take these actions to reduce your risk of viral infection:  Eat a healthy diet and get enough rest.  Wash your hands often with soap and water. This is especially important when you are in public places. If soap and water are not available, use hand sanitizer.  Avoid close contact with friends and family who have a viral illness.  If you travel to areas where viral gastrointestinal infection is common, avoid drinking water or eating raw food.  Keep your immunizations up to date. Get a flu shot every year as told by your health care provider.  Do not share toothbrushes, nail clippers, razors, or needles with other people.  Always practice safe sex. Contact a health care provider if:  You have symptoms of a viral illness that do not go away.  Your symptoms come back after going away.  Your symptoms get worse. Get help right away if:  You have trouble breathing.  You have a severe headache or a stiff neck.  You have severe vomiting or abdominal pain. This information is not intended to replace advice given to you by your health care provider. Make sure you discuss any questions you have with your health care provider. Document Released: 07/22/2015 Document Revised: 08/24/2015 Document Reviewed: 07/22/2015 Elsevier Interactive Patient Education  2017 [employer] Inc.     Agustina Caroli, MD [facility]

## 2016-08-09 NOTE — Patient Instructions (Addendum)
   IF you received an x-ray today, you will receive an invoice from [facility]. Please contact [facility] at [phone] with questions or concerns regarding your invoice.   IF you received labwork today, you will receive an invoice from [facility]. Please contact [facility] at [phone] with questions or concerns regarding your invoice.   Our billing staff will not be able to assist you with questions regarding bills from these companies.  You will be contacted with the lab results as soon as they are available. The fastest way to get your results is to activate your My Chart account. Instructions are located on the last page of this paperwork. If you have not heard from us regarding the results in 2 weeks, please contact this office.     Viral Illness, Adult Viruses are tiny germs that can get into a person's body and cause illness. There are many different types of viruses, and they cause many types of illness. Viral illnesses can range from mild to severe. They can affect various parts of the body. Common illnesses that are caused by a virus include colds and the flu. Viral illnesses also include serious conditions such as HIV/AIDS (human immunodeficiency virus/acquired immunodeficiency syndrome). A few viruses have been linked to certain cancers. What are the causes? Many types of viruses can cause illness. Viruses invade cells in your body, multiply, and cause the infected cells to malfunction or die. When the cell dies, it releases more of the virus. When this happens, you develop symptoms of the illness, and the virus continues to spread to other cells. If the virus takes over the function of the cell, it can cause the cell to divide and grow out of control, as is the case when a virus causes cancer. Different viruses get into the body in different ways. You can get a virus by:  Swallowing food or water that is contaminated with the virus.  Breathing in droplets  that have been coughed or sneezed into the air by an infected person.  Touching a surface that has been contaminated with the virus and then touching your eyes, nose, or mouth.  Being bitten by an insect or animal that carries the virus.  Having sexual contact with a person who is infected with the virus.  Being exposed to blood or fluids that contain the virus, either through an open cut or during a transfusion. If a virus enters your body, your body's defense system (immune system) will try to fight the virus. You may be at higher risk for a viral illness if your immune system is weak. What are the signs or symptoms? Symptoms vary depending on the type of virus and the location of the cells that it invades. Common symptoms of the main types of viral illnesses include: Cold and flu viruses   Fever.  Headache.  Sore throat.  Muscle aches.  Nasal congestion.  Cough. Digestive system (gastrointestinal) viruses   Fever.  Abdominal pain.  Nausea.  Diarrhea. Liver viruses (hepatitis)   Loss of appetite.  Tiredness.  Yellowing of the skin (jaundice). Brain and spinal cord viruses   Fever.  Headache.  Stiff neck.  Nausea and vomiting.  Confusion or sleepiness. Skin viruses   Warts.  Itching.  Rash. Sexually transmitted viruses   Discharge.  Swelling.  Redness.  Rash. How is this treated? Viruses can be difficult to treat because they live within cells. Antibiotic medicines do not treat viruses because these drugs do not get inside cells.   Treatment for a viral illness may include:  Resting and drinking plenty of fluids.  Medicines to relieve symptoms. These can include over-the-counter medicine for pain and fever, medicines for cough or congestion, and medicines to relieve diarrhea.  Antiviral medicines. These drugs are available only for certain types of viruses. They may help reduce flu symptoms if taken early. There are also many antiviral  medicines for hepatitis and HIV/AIDS. Some viral illnesses can be prevented with vaccinations. A common example is the flu shot. Follow these instructions at home: Medicines    Take over-the-counter and prescription medicines only as told by your health care provider.  If you were prescribed an antiviral medicine, take it as told by your health care provider. Do not stop taking the medicine even if you start to feel better.  Be aware of when antibiotics are needed and when they are not needed. Antibiotics do not treat viruses. If your health care provider thinks that you may have a bacterial infection as well as a viral infection, you may get an antibiotic.  Do not ask for an antibiotic prescription if you have been diagnosed with a viral illness. That will not make your illness go away faster.  Frequently taking antibiotics when they are not needed can lead to antibiotic resistance. When this develops, the medicine no longer works against the bacteria that it normally fights. General instructions   Drink enough fluids to keep your urine clear or pale yellow.  Rest as much as possible.  Return to your normal activities as told by your health care provider. Ask your health care provider what activities are safe for you.  Keep all follow-up visits as told by your health care provider. This is important. How is this prevented? Take these actions to reduce your risk of viral infection:  Eat a healthy diet and get enough rest.  Wash your hands often with soap and water. This is especially important when you are in public places. If soap and water are not available, use hand sanitizer.  Avoid close contact with friends and family who have a viral illness.  If you travel to areas where viral gastrointestinal infection is common, avoid drinking water or eating raw food.  Keep your immunizations up to date. Get a flu shot every year as told by your health care provider.  Do not share  toothbrushes, nail clippers, razors, or needles with other people.  Always practice safe sex. Contact a health care provider if:  You have symptoms of a viral illness that do not go away.  Your symptoms come back after going away.  Your symptoms get worse. Get help right away if:  You have trouble breathing.  You have a severe headache or a stiff neck.  You have severe vomiting or abdominal pain. This information is not intended to replace advice given to you by your health care provider. Make sure you discuss any questions you have with your health care provider. Document Released: 07/22/2015 Document Revised: 08/24/2015 Document Reviewed: 07/22/2015 Elsevier Interactive Patient Education  2017 Elsevier Inc.   

## 2016-08-10 LAB — COMPREHENSIVE METABOLIC PANEL
A/G RATIO: 1.8 (ref 1.2–2.2)
ALK PHOS: 74 IU/L (ref 39–117)
ALT: 89 IU/L — ABNORMAL HIGH (ref 0–44)
AST: 39 IU/L (ref 0–40)
Albumin: 4.3 g/dL (ref 3.5–5.5)
BUN/Creatinine Ratio: 15 (ref 9–20)
BUN: 15 mg/dL (ref 6–24)
Bilirubin Total: 0.4 mg/dL (ref 0.0–1.2)
CALCIUM: 9.6 mg/dL (ref 8.7–10.2)
CO2: 22 mmol/L (ref 18–29)
CREATININE: 0.98 mg/dL (ref 0.76–1.27)
Chloride: 100 mmol/L (ref 96–106)
GFR calc Af Amer: 104 mL/min/{1.73_m2} (ref >59)
GFR calc non Af Amer: 90 mL/min/{1.73_m2} (ref >59)
GLOBULIN, TOTAL: 2.4 g/dL (ref 1.5–4.5)
Glucose: 100 mg/dL — ABNORMAL HIGH (ref 65–99)
Potassium: 4.2 mmol/L (ref 3.5–5.2)
SODIUM: 140 mmol/L (ref 134–144)
Total Protein: 6.7 g/dL (ref 6.0–8.5)

## 2016-09-30 ENCOUNTER — Encounter: Payer: Self-pay | Admitting: Family Medicine

## 2016-10-05 ENCOUNTER — Other Ambulatory Visit: Payer: Self-pay | Admitting: Family Medicine

## 2016-10-05 DIAGNOSIS — G8929 Other chronic pain: Secondary | ICD-10-CM

## 2016-10-05 DIAGNOSIS — M545 Low back pain: Principal | ICD-10-CM

## 2016-10-17 DIAGNOSIS — D229 Melanocytic nevi, unspecified: Secondary | ICD-10-CM | POA: Diagnosis not present

## 2016-10-17 DIAGNOSIS — L719 Rosacea, unspecified: Secondary | ICD-10-CM | POA: Diagnosis not present

## 2016-10-23 DIAGNOSIS — M545 Low back pain: Secondary | ICD-10-CM | POA: Diagnosis not present

## 2016-10-26 DIAGNOSIS — M545 Low back pain: Secondary | ICD-10-CM | POA: Diagnosis not present

## 2016-10-31 DIAGNOSIS — M545 Low back pain: Secondary | ICD-10-CM | POA: Diagnosis not present

## 2016-11-06 DIAGNOSIS — M545 Low back pain: Secondary | ICD-10-CM | POA: Diagnosis not present

## 2016-11-09 DIAGNOSIS — M545 Low back pain: Secondary | ICD-10-CM | POA: Diagnosis not present

## 2016-11-14 DIAGNOSIS — M545 Low back pain: Secondary | ICD-10-CM | POA: Diagnosis not present

## 2016-11-20 DIAGNOSIS — M545 Low back pain: Secondary | ICD-10-CM | POA: Diagnosis not present

## 2016-11-23 DIAGNOSIS — M545 Low back pain: Secondary | ICD-10-CM | POA: Diagnosis not present

## 2016-11-28 DIAGNOSIS — M545 Low back pain: Secondary | ICD-10-CM | POA: Diagnosis not present

## 2016-12-04 DIAGNOSIS — M545 Low back pain: Secondary | ICD-10-CM | POA: Diagnosis not present

## 2016-12-07 DIAGNOSIS — M545 Low back pain: Secondary | ICD-10-CM | POA: Diagnosis not present

## 2016-12-12 DIAGNOSIS — M545 Low back pain: Secondary | ICD-10-CM | POA: Diagnosis not present

## 2016-12-21 DIAGNOSIS — M545 Low back pain: Secondary | ICD-10-CM | POA: Diagnosis not present

## 2017-01-04 DIAGNOSIS — M545 Low back pain: Secondary | ICD-10-CM | POA: Diagnosis not present

## 2017-01-04 DIAGNOSIS — Z23 Encounter for immunization: Secondary | ICD-10-CM | POA: Diagnosis not present

## 2017-01-10 DIAGNOSIS — M545 Low back pain: Secondary | ICD-10-CM | POA: Diagnosis not present

## 2017-01-15 DIAGNOSIS — M545 Low back pain: Secondary | ICD-10-CM | POA: Diagnosis not present

## 2017-01-18 DIAGNOSIS — M545 Low back pain: Secondary | ICD-10-CM | POA: Diagnosis not present

## 2017-01-23 DIAGNOSIS — M545 Low back pain: Secondary | ICD-10-CM | POA: Diagnosis not present

## 2017-01-29 DIAGNOSIS — M545 Low back pain: Secondary | ICD-10-CM | POA: Diagnosis not present

## 2017-02-01 DIAGNOSIS — M545 Low back pain: Secondary | ICD-10-CM | POA: Diagnosis not present

## 2017-02-07 DIAGNOSIS — M1611 Unilateral primary osteoarthritis, right hip: Secondary | ICD-10-CM | POA: Diagnosis not present

## 2017-02-07 DIAGNOSIS — M65311 Trigger thumb, right thumb: Secondary | ICD-10-CM | POA: Diagnosis not present

## 2017-02-07 DIAGNOSIS — M25551 Pain in right hip: Secondary | ICD-10-CM | POA: Diagnosis not present

## 2017-02-07 DIAGNOSIS — M5441 Lumbago with sciatica, right side: Secondary | ICD-10-CM | POA: Diagnosis not present

## 2017-02-07 DIAGNOSIS — M545 Low back pain: Secondary | ICD-10-CM | POA: Diagnosis not present

## 2017-02-21 DIAGNOSIS — M545 Low back pain: Secondary | ICD-10-CM | POA: Diagnosis not present

## 2017-02-26 DIAGNOSIS — M47818 Spondylosis without myelopathy or radiculopathy, sacral and sacrococcygeal region: Secondary | ICD-10-CM | POA: Diagnosis not present

## 2017-02-26 DIAGNOSIS — M7061 Trochanteric bursitis, right hip: Secondary | ICD-10-CM | POA: Diagnosis not present

## 2017-06-27 ENCOUNTER — Encounter: Payer: Self-pay | Admitting: Family Medicine

## 2017-06-27 ENCOUNTER — Other Ambulatory Visit: Payer: Self-pay

## 2017-06-27 ENCOUNTER — Ambulatory Visit (INDEPENDENT_AMBULATORY_CARE_PROVIDER_SITE_OTHER): Payer: 59 | Admitting: Family Medicine

## 2017-06-27 VITALS — BP 112/70 | HR 83 | Temp 97.8°F | Ht 74.5 in | Wt 214.4 lb

## 2017-06-27 DIAGNOSIS — R945 Abnormal results of liver function studies: Secondary | ICD-10-CM

## 2017-06-27 DIAGNOSIS — Z1211 Encounter for screening for malignant neoplasm of colon: Secondary | ICD-10-CM

## 2017-06-27 DIAGNOSIS — R7989 Other specified abnormal findings of blood chemistry: Secondary | ICD-10-CM

## 2017-06-27 DIAGNOSIS — Z125 Encounter for screening for malignant neoplasm of prostate: Secondary | ICD-10-CM

## 2017-06-27 DIAGNOSIS — E785 Hyperlipidemia, unspecified: Secondary | ICD-10-CM | POA: Diagnosis not present

## 2017-06-27 DIAGNOSIS — Z23 Encounter for immunization: Secondary | ICD-10-CM | POA: Diagnosis not present

## 2017-06-27 DIAGNOSIS — Z0000 Encounter for general adult medical examination without abnormal findings: Secondary | ICD-10-CM

## 2017-06-27 MED ORDER — ZOSTER VAC RECOMB ADJUVANTED 50 MCG/0.5ML IM SUSR: 0.5000 mL | Freq: Once | INTRAMUSCULAR | 1 refills | Status: AC

## 2017-06-27 NOTE — Progress Notes (Signed)
Subjective:    Patient ID: Kyle Swanson, male    DOB: 1966/02/11, 52 y.o.   MRN: [id number]  HPI Kyle Swanson is a 52 y.o. male Presents today for: Chief Complaint  Patient presents with  . Wellness exam    CPE   Here for annual exam/physical.  Last physical March 2018.  He has a history of allergic rhinitis, asthma, chronic low back pain treated by Ortho and physical therapy previously, mild hyperlipidemia, and elevated LFTs  Allergic rhinitis/asthma: He takes Flonase nasal spray, Claritin over-the-counter. Some redness and itching in the eyes. Has some gtts - "real tears".   Hyperlipidemia: Elevated when checked in March of last year, plan for diet and exercise approach initially with repeat test in 6 months. Lab Results  Component Value Date   CHOL 211 (H) 06/18/2016   HDL 63 06/18/2016   LDLCALC 129 (H) 06/18/2016   TRIG 96 06/18/2016   CHOLHDL 3.3 06/18/2016    Elevated LFTs: Discussed last year, was drinking 2-3 alcoholic drinks approximately 3 days/week.  Reportedly had been tested off alcohol with persistent elevated LFTs. Lab Results  Component Value Date   ALT 89 (H) 08/09/2016   AST 39 08/09/2016   ALKPHOS 74 08/09/2016   BILITOT 0.4 08/09/2016  ALT 66 1 year ago, 88 when checked 2 years ago. 2-3 drinks, 1-2 days per week. No tylenol.   Chronic low back pain: Lumbar spine x-ray in 2016 with unilateral pars defect at L5 on the right without spondylolisthesis.  There was slight facet osteoarthritic change at L5-S1 bilaterally.  He was seen by Ortho in November.  Plan for MRI.  Plan for possible injections. Had MRI, told he had bursa inflammation R hip and DDD in lumbar spine. Had injections in back and hip.  Has been doing home PT. Last seen by ortho few months ago. Feels overall better. Not every day. Rare pain meds- meloxicam if needed only.   Cancer screening: Colonoscopy: has not had. No known FH of colon CA.  Prostate cancer testing: Lab Results    Component Value Date   PSA 0.83 01/31/2015   PSA 1.16 10/06/2012   PSA 1.11 04/28/2012   Immunizations: Immunization History  Administered Date(s) Administered  . Influenza, Seasonal, Injecte, Preservative Fre 04/28/2012  . Influenza-Unspecified 12/25/2014, 01/19/2016, 01/04/2017  . Tdap 04/28/2012  shingles vaccine - will send to pharmacy today.   Depression screening: Depression screen [facility] 2/9 06/27/2017 08/09/2016 08/01/2016 06/18/2016 01/31/2015  Decreased Interest 0 0 0 0 0  Down, Depressed, Hopeless 0 0 0 0 0  PHQ - 2 Score 0 0 0 0 0   Vision screening:  Visual Acuity Screening   Right eye Left eye Both eyes  Without correction:     With correction: 20/20 20/20 20/20   saw optho in December - no concerns. No CL's - only glasses.   Dentist: Last exam in past 6 months - goes every 6 months.   Exercise: 2 days per week - increasing now that weather has improved.    Patient Active Problem List   Diagnosis Date Noted  . General weakness 08/09/2016  . Viral illness 08/09/2016  . Palpitations 01/05/2013  . Allergy   . Asthma   . Allergic rhinitis 04/28/2012   Past Medical History:  Diagnosis Date  . Allergic rhinitis 04/28/2012  . Allergy   . Anxiety   . Asthma    Past Surgical History:  Procedure Laterality Date  . GANGLION CYST EXCISION  left wrist 10 years ago  . TONSILLECTOMY AND ADENOIDECTOMY     childhood   Allergies  Allergen Reactions  . Sulfa Antibiotics Rash   Prior to Admission medications   Medication Sig Start Date End Date Taking? Authorizing Provider  fluticasone (FLONASE) 50 MCG/ACT nasal spray Place into both nostrils daily.   Yes [provider]  hydrocortisone cream 1 % Apply 1 application topically 2 (two) times daily.   Yes [provider]  loratadine (CLARITIN) 10 MG tablet Take 10 mg by mouth daily.   Yes [provider]  meloxicam (MOBIC) 15 MG tablet Take 15 mg by mouth as needed for pain.   Yes [provider]  metroNIDAZOLE (METROCREAM) 0.75 % cream Apply topically 2 (two) times daily.   Yes [provider]  vitamin B-12 (CYANOCOBALAMIN) 100 MCG tablet Take 100 mcg by mouth daily.   Yes [provider]  cholecalciferol (VITAMIN D) 1000 units tablet Take 1,000 Units by mouth daily.    [provider]   Social History   Socioeconomic History  . Marital status: Single    Spouse name: Not on file  . Number of children: Not on file  . Years of education: Not on file  . Highest education level: Not on file  Occupational History  . Occupation: Neurosurgeon  Social Needs  . Financial resource strain: Not on file  . Food insecurity:    Worry: Not on file    Inability: Not on file  . Transportation needs:    Medical: Not on file    Non-medical: Not on file  Tobacco Use  . Smoking status: Never Smoker  . Smokeless tobacco: Never Used  Substance and Sexual Activity  . Alcohol use: Yes    Alcohol/week: 7.2 oz    Types: 12 Standard drinks or equivalent per week    Comment: BEER  . Drug use: No  . Sexual activity: Yes    Birth control/protection: Condom, IUD    Comment: number of sex partners in the last 3 months - 4  Lifestyle  . Physical activity:    Days per week: Not on file    Minutes per session: Not on file  . Stress: Not on file  Relationships  . Social connections:    Talks on phone: Not on file    Gets together: Not on file    Attends religious service: Not on file    Active member of club or organization: Not on file    Attends meetings of clubs or organizations: Not on file    Relationship status: Not on file  . Intimate partner violence:    Fear of current or ex partner: Not on file    Emotionally abused: Not on file    Physically abused: Not on file    Forced sexual activity: Not on file  Other Topics Concern  . Not on file  Social History Narrative   Divorced. Education: [employer]. Exercise: No.    Review of  Systems 13 point review of systems per patient health survey noted.  Negative other than listed above.     Objective:   Physical Exam  Constitutional: He is oriented to person, place, and time. He appears well-developed and well-nourished.  HENT:  Head: Normocephalic and atraumatic.  Right Ear: External ear normal.  Left Ear: External ear normal.  Mouth/Throat: Oropharynx is clear and moist.  Eyes: Pupils are equal, round, and reactive to light. Conjunctivae and EOM are normal.  Neck: Normal range of motion. Neck supple. No thyromegaly present.  Cardiovascular: Normal rate, regular rhythm, normal heart sounds and intact distal pulses.  Pulmonary/Chest: Effort normal and breath sounds normal. No respiratory distress. He has no wheezes.  Abdominal: Soft. He exhibits no distension. There is no tenderness. Hernia confirmed negative in the right inguinal area and confirmed negative in the left inguinal area.  Genitourinary: Prostate normal.  Musculoskeletal: Normal range of motion. He exhibits no edema or tenderness.  Lymphadenopathy:    He has no cervical adenopathy.  Neurological: He is alert and oriented to person, place, and time. He has normal reflexes.  Skin: Skin is warm and dry.  Psychiatric: He has a normal mood and affect. His behavior is normal.  Vitals reviewed.  Vitals:   06/27/17 0913  BP: 112/70  Pulse: 83  Temp: 97.8 F (36.6 C)  TempSrc: Oral  SpO2: 96%  Weight: 214 lb 6.4 oz (97.3 kg)  Height: 6' 2.5" (1.892 m)       Assessment & Plan:  Laquentin Loudermilk is a 52 y.o. male Annual physical exam  - -anticipatory guidance as below in AVS, screening labs above. Health maintenance items as above in HPI discussed/recommended as applicable.   Hyperlipidemia , unspecified hyperlipidemia type - Plan: Lipid panel  -Repeat levels, consider statin depending on ASCVD risk.  Diet, increase exercise.  Elevated LFTs - Plan: Comprehensive metabolic panel  -Repeat levels,  consider ultrasound versus discussion with gastroenterology.  Will be referred to GI for colonoscopy as well  Screening for malignant neoplasm of prostate - Plan: PSA  - We discussed pros and cons of prostate cancer screening, and after this discussion, he chose to have screening done. PSA obtained, and no concerning findings on DRE.   Special screening for malignant neoplasms, colon - Plan: Ambulatory referral to Gastroenterology   History of allergies, discussed over-the-counter Zaditor for eye allergy symptoms if needed, continue Flonase, Claritin.  RTC precautions.   Continue follow-up with orthopedist for back and hip pain.  Overall stable at present.  No orders of the defined types were placed in this encounter.  Patient Instructions   Follow up with ortho if needed for back/hip pain.   Zaditor over the counter if needed for allergy symptoms in the eyes, but let me know if you need a prescription med. Continue the flonase and claritin.   I will repeat liver tests - if still elevated, can look at checking ultrasound of liver.   I will repeat cholesterol test.  Depending on results can discuss if medication needed - continue to work on diet and exercise for now.   I will refer you to gastroenterology for colon cancer screening.   Shingles vaccine sent to your pharmacy.   Thanks for coming in today.   Keeping you healthy  Get these tests  Blood pressure- Have your blood pressure checked once a year by your healthcare provider.  Normal blood pressure is 120/80  Weight- Have your body mass index (BMI) calculated to screen for obesity.  BMI is a measure of body fat based on height and weight. You can also calculate your own BMI at [email or web address].  Cholesterol- Have your cholesterol checked every year.  Diabetes- Have your blood sugar checked regularly if you have high blood pressure, high cholesterol, have a family history of diabetes or if you are  overweight.  Screening for Colon Cancer- Colonoscopy starting at age 16.  Screening may begin sooner depending on your family history and other health  conditions. Follow up colonoscopy as directed by your Gastroenterologist.  Screening for Prostate Cancer- Both blood work (PSA) and a rectal exam help screen for Prostate Cancer.  Screening begins at age 45 with African-American men and at age 25 with Caucasian men.  Screening may begin sooner depending on your family history.  Take these medicines  Aspirin- One aspirin daily can help prevent Heart disease and Stroke.  Flu shot- Every fall.  Tetanus- Every 10 years.  Zostavax- Once after the age of 33 to prevent Shingles.  Pneumonia shot- Once after the age of 83; if you are younger than 35, ask your healthcare provider if you need a Pneumonia shot.  Take these steps  Don't smoke- If you do smoke, talk to your doctor about quitting.  For tips on how to quit, go to www.smokefree.gov or call 1-800-QUIT-NOW.  Be physically active- Exercise 5 days a week for at least 30 minutes.  If you are not already physically active start slow and gradually work up to 30 minutes of moderate physical activity.  Examples of moderate activity include walking briskly, mowing the yard, dancing, swimming, bicycling, etc.  Eat a healthy diet- Eat a variety of healthy food such as fruits, vegetables, low fat milk, low fat cheese, yogurt, lean meant, poultry, fish, beans, tofu, etc. For more information go to www.thenutritionsource.org  Drink alcohol in moderation- Limit alcohol intake to less than two drinks a day. Never drink and drive.  Dentist- Brush and floss twice daily; visit your dentist twice a year.  Depression- Your emotional health is as important as your physical health. If you're feeling down, or losing interest in things you would normally enjoy please talk to your healthcare provider.  Eye exam- Visit your eye doctor every year.  Safe sex- If  you may be exposed to a sexually transmitted infection, use a condom.  Seat belts- Seat belts can save your life; always wear one.  Smoke/Carbon Monoxide detectors- These detectors need to be installed on the appropriate level of your home.  Replace batteries at least once a year.  Skin cancer- When out in the sun, cover up and use sunscreen 15 SPF or higher.  Violence- If anyone is threatening you, please tell your healthcare provider.  Living Will/ Health care power of attorney- Speak with your healthcare provider and family.   IF you received an x-ray today, you will receive an invoice from [facility]. Please contact [facility] at [phone] with questions or concerns regarding your invoice.   IF you received labwork today, you will receive an invoice from [place]. Please contact [facility] at [phone] with questions or concerns regarding your invoice.   Our billing staff will not be able to assist you with questions regarding bills from these companies.  You will be contacted with the lab results as soon as they are available. The fastest way to get your results is to activate your My Chart account. Instructions are located on the last page of this paperwork. If you have not heard from [place] regarding the results in 2 weeks, please contact this office.

## 2017-06-27 NOTE — Patient Instructions (Addendum)
Follow up with ortho if needed for back/hip pain.   Zaditor over the counter if needed for allergy symptoms in the eyes, but let me know if you need a prescription med. Continue the flonase and claritin.   I will repeat liver tests - if still elevated, can look at checking ultrasound of liver.   I will repeat cholesterol test.  Depending on results can discuss if medication needed - continue to work on diet and exercise for now.   I will refer you to gastroenterology for colon cancer screening.   Shingles vaccine sent to your pharmacy.   Thanks for coming in today.   Keeping you healthy  Get these tests  Blood pressure- Have your blood pressure checked once a year by your healthcare provider.  Normal blood pressure is 120/80  Weight- Have your body mass index (BMI) calculated to screen for obesity.  BMI is a measure of body fat based on height and weight. You can also calculate your own BMI at [email or web address].  Cholesterol- Have your cholesterol checked every year.  Diabetes- Have your blood sugar checked regularly if you have high blood pressure, high cholesterol, have a family history of diabetes or if you are overweight.  Screening for Colon Cancer- Colonoscopy starting at age 51.  Screening may begin sooner depending on your family history and other health conditions. Follow up colonoscopy as directed by your Gastroenterologist.  Screening for Prostate Cancer- Both blood work (PSA) and a rectal exam help screen for Prostate Cancer.  Screening begins at age 51 with African-American men and at age 51 with Caucasian men.  Screening may begin sooner depending on your family history.  Take these medicines  Aspirin- One aspirin daily can help prevent Heart disease and Stroke.  Flu shot- Every fall.  Tetanus- Every 10 years.  Zostavax- Once after the age of 50 to prevent Shingles.  Pneumonia shot- Once after the age of 44; if you are younger than 33, ask your healthcare  provider if you need a Pneumonia shot.  Take these steps  Don't smoke- If you do smoke, talk to your doctor about quitting.  For tips on how to quit, go to www.smokefree.gov or call 1-800-QUIT-NOW.  Be physically active- Exercise 5 days a week for at least 30 minutes.  If you are not already physically active start slow and gradually work up to 30 minutes of moderate physical activity.  Examples of moderate activity include walking briskly, mowing the yard, dancing, swimming, bicycling, etc.  Eat a healthy diet- Eat a variety of healthy food such as fruits, vegetables, low fat milk, low fat cheese, yogurt, lean meant, poultry, fish, beans, tofu, etc. For more information go to www.thenutritionsource.org  Drink alcohol in moderation- Limit alcohol intake to less than two drinks a day. Never drink and drive.  Dentist- Brush and floss twice daily; visit your dentist twice a year.  Depression- Your emotional health is as important as your physical health. If you're feeling down, or losing interest in things you would normally enjoy please talk to your healthcare provider.  Eye exam- Visit your eye doctor every year.  Safe sex- If you may be exposed to a sexually transmitted infection, use a condom.  Seat belts- Seat belts can save your life; always wear one.  Smoke/Carbon Monoxide detectors- These detectors need to be installed on the appropriate level of your home.  Replace batteries at least once a year.  Skin cancer- When out in the sun, cover up and use  sunscreen 15 SPF or higher.  Violence- If anyone is threatening you, please tell your healthcare provider.  Living Will/ Health care power of attorney- Speak with your healthcare provider and family.   IF you received an x-ray today, you will receive an invoice from [facility]. Please contact [facility] at [phone] with questions or concerns regarding your invoice.   IF you received labwork today, you will  receive an invoice from [place]. Please contact [facility] at [phone] with questions or concerns regarding your invoice.   Our billing staff will not be able to assist you with questions regarding bills from these companies.  You will be contacted with the lab results as soon as they are available. The fastest way to get your results is to activate your My Chart account. Instructions are located on the last page of this paperwork. If you have not heard from [place] regarding the results in 2 weeks, please contact this office.

## 2017-06-28 LAB — LIPID PANEL
CHOL/HDL RATIO: 4 ratio (ref 0.0–5.0)
Cholesterol, Total: 210 mg/dL — ABNORMAL HIGH (ref 100–199)
HDL: 53 mg/dL (ref >39)
LDL Calculated: 133 mg/dL — ABNORMAL HIGH (ref 0–99)
Triglycerides: 121 mg/dL (ref 0–149)
VLDL Cholesterol Cal: 24 mg/dL (ref 5–40)

## 2017-06-28 LAB — COMPREHENSIVE METABOLIC PANEL
A/G RATIO: 1.9 (ref 1.2–2.2)
ALBUMIN: 4.7 g/dL (ref 3.5–5.5)
ALT: 71 IU/L — AB (ref 0–44)
AST: 35 IU/L (ref 0–40)
Alkaline Phosphatase: 80 IU/L (ref 39–117)
BUN/Creatinine Ratio: 15 (ref 9–20)
BUN: 18 mg/dL (ref 6–24)
Bilirubin Total: 0.7 mg/dL (ref 0.0–1.2)
CALCIUM: 9.9 mg/dL (ref 8.7–10.2)
CO2: 20 mmol/L (ref 20–29)
Chloride: 103 mmol/L (ref 96–106)
Creatinine, Ser: 1.2 mg/dL (ref 0.76–1.27)
GFR, EST AFRICAN AMERICAN: 81 mL/min/{1.73_m2} (ref >59)
GFR, EST NON AFRICAN AMERICAN: 70 mL/min/{1.73_m2} (ref >59)
GLOBULIN, TOTAL: 2.5 g/dL (ref 1.5–4.5)
Glucose: 91 mg/dL (ref 65–99)
POTASSIUM: 4.1 mmol/L (ref 3.5–5.2)
Sodium: 143 mmol/L (ref 134–144)
TOTAL PROTEIN: 7.2 g/dL (ref 6.0–8.5)

## 2017-06-28 LAB — PSA: PROSTATE SPECIFIC AG, SERUM: 1.1 ng/mL (ref 0.0–4.0)

## 2017-08-20 ENCOUNTER — Encounter: Payer: Self-pay | Admitting: Gastroenterology

## 2017-10-08 DIAGNOSIS — Z23 Encounter for immunization: Secondary | ICD-10-CM | POA: Diagnosis not present

## 2017-10-17 ENCOUNTER — Ambulatory Visit (AMBULATORY_SURGERY_CENTER): Payer: Self-pay | Admitting: *Deleted

## 2017-10-17 ENCOUNTER — Encounter: Payer: Self-pay | Admitting: Gastroenterology

## 2017-10-17 VITALS — Ht 74.0 in | Wt 212.0 lb

## 2017-10-17 DIAGNOSIS — Z1211 Encounter for screening for malignant neoplasm of colon: Secondary | ICD-10-CM

## 2017-10-17 MED ORDER — PEG-KCL-NACL-NASULF-NA ASC-C 140 G PO SOLR: 1.0000 | Freq: Once | ORAL | 0 refills | Status: AC

## 2017-10-17 NOTE — Progress Notes (Signed)
Patient denies any allergies to eggs or soy. Patient denies any problems with anesthesia/sedation. Patient denies any oxygen use at home. Patient denies taking any diet/weight loss medications or blood thinners. EMMI education assisgned to patient on colonoscopy, this was explained and instructions given to patient. Plenvu universal co pay card given to pt and explained.

## 2017-10-31 ENCOUNTER — Ambulatory Visit (AMBULATORY_SURGERY_CENTER): Payer: 59 | Admitting: Gastroenterology

## 2017-10-31 ENCOUNTER — Encounter: Payer: Self-pay | Admitting: Gastroenterology

## 2017-10-31 VITALS — BP 121/74 | HR 70 | Temp 98.4°F | Resp 12 | Wt 212.0 lb

## 2017-10-31 DIAGNOSIS — D122 Benign neoplasm of ascending colon: Secondary | ICD-10-CM | POA: Diagnosis not present

## 2017-10-31 DIAGNOSIS — Z1211 Encounter for screening for malignant neoplasm of colon: Secondary | ICD-10-CM | POA: Diagnosis not present

## 2017-10-31 MED ORDER — SODIUM CHLORIDE 0.9 % IV SOLN: 500.0000 mL | Freq: Once | INTRAVENOUS | Status: DC

## 2017-10-31 NOTE — Op Note (Signed)
[facility] Patient Name: Kyle Swanson Procedure Date: 10/31/2017 8:00 AM MRN: [id number] Endoscopist: Mallie Mussel L. Loletha Carrow , MD Age: 52 Referring MD:  Date of Birth: [birth year] Gender: Male Account #: [id number] Procedure:                Colonoscopy Indications:              Screening for colorectal malignant neoplasm, This                            is the patient's first colonoscopy Medicines:                Monitored Anesthesia Care Procedure:                Pre-Anesthesia Assessment:                           - Prior to the procedure, a History and Physical                            was performed, and patient medications and                            allergies were reviewed. The patient's tolerance of                            previous anesthesia was also reviewed. The risks                            and benefits of the procedure and the sedation                            options and risks were discussed with the patient.                            All questions were answered, and informed consent                            was obtained. Prior Anticoagulants: The patient has                            taken no previous anticoagulant or antiplatelet                            agents. ASA Grade Assessment: II - A patient with                            mild systemic disease. After reviewing the risks                            and benefits, the patient was deemed in                            satisfactory condition to undergo the procedure.  After obtaining informed consent, the colonoscope                            was passed under direct vision. Throughout the                            procedure, the patient's blood pressure, pulse, and                            oxygen saturations were monitored continuously. The                            Colonoscope was introduced through the anus and                            advanced to the the cecum,  identified by                            appendiceal orifice and ileocecal valve. The                            colonoscopy was performed without difficulty. The                            patient tolerated the procedure well. The quality                            of the bowel preparation was excellent. The                            ileocecal valve, appendiceal orifice, and rectum                            were photographed. The quality of the bowel                            preparation was evaluated using the BBPS [facility]                            Bowel Preparation Scale) with scores of: Right                            Colon = 3, Transverse Colon = 3 and Left Colon = 3                            (entire mucosa seen well with no residual staining,                            small fragments of stool or opaque liquid). The                            total BBPS score equals 9. The bowel preparation  used was Plenvu. Scope In: 8:05:05 AM Scope Out: 8:19:01 AM Scope Withdrawal Time: 0 hours 12 minutes 5 seconds  Total Procedure Duration: 0 hours 13 minutes 56 seconds  Findings:                 The perianal and digital rectal examinations were                            normal.                           A 2 mm polyp was found in the proximal ascending                            colon. The polyp was sessile. The polyp was removed                            with a cold biopsy forceps. Resection and retrieval                            were complete.                           The exam was otherwise without abnormality on                            direct and retroflexion views. Complications:            No immediate complications. Estimated Blood Loss:     Estimated blood loss: none. Impression:               - One 2 mm polyp in the proximal ascending colon,                            removed with a cold biopsy forceps. Resected and                             retrieved.                           - The examination was otherwise normal on direct                            and retroflexion views. Recommendation:           - Patient has a contact number available for                            emergencies. The signs and symptoms of potential                            delayed complications were discussed with the                            patient. Return to normal activities tomorrow.  Written discharge instructions were provided to the                            patient.                           - Resume previous diet.                           - Continue present medications.                           - Await pathology results.                           - Repeat colonoscopy is recommended for                            surveillance. The colonoscopy date will be                            determined after pathology results from today's                            exam become available for review.  L. Loletha Carrow, MD 10/31/2017 8:21:23 AM This report has been signed electronically.

## 2017-10-31 NOTE — Progress Notes (Signed)
Pt's states no medical or surgical changes since previsit or office visit. 

## 2017-10-31 NOTE — Progress Notes (Signed)
Report to PACU, RN, vss, BBS= Clear.  

## 2017-10-31 NOTE — Patient Instructions (Signed)
YOU HAD AN ENDOSCOPIC PROCEDURE TODAY AT THE [facility]:   Refer to the procedure report that was given to you for any specific questions about what was found during the examination.  If the procedure report does not answer your questions, please call your gastroenterologist to clarify.  If you requested that your care partner not be given the details of your procedure findings, then the procedure report has been included in a sealed envelope for you to review at your convenience later.  YOU SHOULD EXPECT: Some feelings of bloating in the abdomen. Passage of more gas than usual.  Walking can help get rid of the air that was put into your GI tract during the procedure and reduce the bloating. If you had a lower endoscopy (such as a colonoscopy or flexible sigmoidoscopy) you may notice spotting of blood in your stool or on the toilet paper. If you underwent a bowel prep for your procedure, you may not have a normal bowel movement for a few days.  Please Note:  You might notice some irritation and congestion in your nose or some drainage.  This is from the oxygen used during your procedure.  There is no need for concern and it should clear up in a day or so.  SYMPTOMS TO REPORT IMMEDIATELY:   Following lower endoscopy (colonoscopy or flexible sigmoidoscopy):  Excessive amounts of blood in the stool  Significant tenderness or worsening of abdominal pains  Swelling of the abdomen that is new, acute  Fever of 100F or higher  For urgent or emergent issues, a gastroenterologist can be reached at any hour by calling [phone].   DIET:  We do recommend a small meal at first, but then you may proceed to your regular diet.  Drink plenty of fluids but you should avoid alcoholic beverages for 24 hours.  MEDICATIONS: Continue present medications.  Please see handouts given to you by your recovery nurse.  ACTIVITY:  You should plan to take it easy for the rest of today and you should NOT  DRIVE or use heavy machinery until tomorrow (because of the sedation medicines used during the test).    FOLLOW UP: Our staff will call the number listed on your records the next business day following your procedure to check on you and address any questions or concerns that you may have regarding the information given to you following your procedure. If we do not reach you, we will leave a message.  However, if you are feeling well and you are not experiencing any problems, there is no need to return our call.  We will assume that you have returned to your regular daily activities without incident.  If any biopsies were taken you will be contacted by phone or by letter within the next 1-3 weeks.  Please call us at [phone] if you have not heard about the biopsies in 3 weeks.   Thank you for allowing us to provide for your healthcare needs today.   SIGNATURES/CONFIDENTIALITY: You and/or your care partner have signed paperwork which will be entered into your electronic medical record.  These signatures attest to the fact that that the information above on your After Visit Summary has been reviewed and is understood.  Full responsibility of the confidentiality of this discharge information lies with you and/or your care-partner. 

## 2017-10-31 NOTE — Progress Notes (Signed)
Called to room to assist during endoscopic procedure.  Patient ID and intended procedure confirmed with present staff. Received instructions for my participation in the procedure from the performing physician.  

## 2017-11-01 ENCOUNTER — Telehealth: Payer: Self-pay | Admitting: *Deleted

## 2017-11-01 NOTE — Telephone Encounter (Signed)
  Follow up Call-  Call back number 10/31/2017  Post procedure Call Back phone  # [phone] wk ask for pt  Permission to leave phone message No  Some recent data might be hidden     Patient questions:  Do you have a fever, pain , or abdominal swelling? No. Pain Score  0 *  Have you tolerated food without any problems? Yes.    Have you been able to return to your normal activities? Yes.    Do you have any questions about your discharge instructions: Diet   No. Medications  No. Follow up visit  No.  Do you have questions or concerns about your Care? No.  Actions: * If pain score is 4 or above: No action needed, pain <4.

## 2017-11-08 ENCOUNTER — Encounter: Payer: Self-pay | Admitting: Gastroenterology

## 2017-12-07 DIAGNOSIS — Z23 Encounter for immunization: Secondary | ICD-10-CM | POA: Diagnosis not present

## 2018-02-25 ENCOUNTER — Ambulatory Visit: Payer: Self-pay | Admitting: Family Medicine

## 2018-02-25 VITALS — BP 115/75 | HR 106 | Temp 98.4°F | Resp 16 | Wt 210.4 lb

## 2018-02-25 DIAGNOSIS — R059 Cough, unspecified: Secondary | ICD-10-CM

## 2018-02-25 DIAGNOSIS — J069 Acute upper respiratory infection, unspecified: Secondary | ICD-10-CM

## 2018-02-25 DIAGNOSIS — R05 Cough: Secondary | ICD-10-CM

## 2018-02-25 MED ORDER — BENZONATATE 200 MG PO CAPS: 200.0000 mg | ORAL_CAPSULE | Freq: Three times a day (TID) | ORAL | 0 refills | Status: DC | PRN

## 2018-02-25 MED ORDER — PSEUDOEPH-BROMPHEN-DM 30-2-10 MG/5ML PO SYRP: 10.0000 mL | ORAL_SOLUTION | Freq: Three times a day (TID) | ORAL | 0 refills | Status: DC | PRN

## 2018-02-25 NOTE — Progress Notes (Signed)
Kyle Swanson is a 52 y.o. male who presents today with concerns of cough and congestion for the last 6 days. New onset ear pain in the last 72 hours. Patient reports cough is most troublesome symptoms. He reports he is otherwise healthy. He has been using over the counter medications with mild relief.   Review of Systems  Constitutional: Negative for chills, fever and malaise/fatigue.  HENT: Positive for ear pain. Negative for congestion, ear discharge, hearing loss, sinus pain, sore throat and tinnitus.   Eyes: Negative.   Respiratory: Positive for cough. Negative for sputum production and shortness of breath.   Cardiovascular: Negative.  Negative for chest pain.  Gastrointestinal: Negative for abdominal pain, diarrhea, nausea and vomiting.  Genitourinary: Negative for dysuria, frequency, hematuria and urgency.  Musculoskeletal: Negative for myalgias.  Skin: Negative.   Neurological: Negative for headaches.  Endo/Heme/Allergies: Negative.   Psychiatric/Behavioral: Negative.     O: Vitals:   02/25/18 1224  BP: 115/75  Pulse: (!) 106  Resp: 16  Temp: 98.4 F (36.9 C)  SpO2: 94%     Physical Exam  Constitutional: He is oriented to person, place, and time. Vital signs are normal. He appears well-developed and well-nourished. He is active.  Non-toxic appearance. He does not have a sickly appearance.  HENT:  Head: Normocephalic.  Right Ear: Hearing, external ear and ear canal normal. Tympanic membrane is bulging.  Left Ear: Hearing, external ear and ear canal normal. Tympanic membrane is bulging.  Nose: Rhinorrhea present. Right sinus exhibits no maxillary sinus tenderness and no frontal sinus tenderness. Left sinus exhibits no maxillary sinus tenderness and no frontal sinus tenderness.  Mouth/Throat: Uvula is midline. Posterior oropharyngeal edema present.  Neck: Normal range of motion. Neck supple.  Cardiovascular: Normal rate, regular rhythm, normal heart sounds and normal  pulses.  Pulmonary/Chest: Effort normal and breath sounds normal.  Abdominal: Soft. Bowel sounds are normal.  Musculoskeletal: Normal range of motion.  Lymphadenopathy:       Head (right side): No submental and no submandibular adenopathy present.       Head (left side): No submental and no submandibular adenopathy present.    He has no cervical adenopathy.  Neurological: He is alert and oriented to person, place, and time.  Psychiatric: He has a normal mood and affect.  Vitals reviewed.  A: 1. Upper respiratory tract infection, unspecified type   2. Cough    P: Discussed exam findings, diagnosis etiology and medication use and indications reviewed with patient. Follow- Up and discharge instructions provided. No emergent/urgent issues found on exam.  Patient verbalized understanding of information provided and agrees with plan of care (POC), all questions answered.  1. Upper respiratory tract infection, unspecified type - brompheniramine-pseudoephedrine-DM 30-2-10 MG/5ML syrup; Take 10 mLs by mouth 3 (three) times daily as needed.  2. Cough - brompheniramine-pseudoephedrine-DM 30-2-10 MG/5ML syrup; Take 10 mLs by mouth 3 (three) times daily as needed. - benzonatate (TESSALON) 200 MG capsule; Take 1 capsule (200 mg total) by mouth 3 (three) times daily as needed.  Discussed with patient suspected likely viral nature of symptoms. Patient with six days of symptoms but no fever-provider discussed if symptoms not improved will consider abx after 02/27/2018. Patient verbalized understanding.  Other orders - sildenafil (REVATIO) 20 MG tablet; sildenafil (pulmonary hypertension) 20 mg tablet  Take 1 tablet by oral route as directed. - dextromethorphan-guaiFENesin (MUCINEX DM) 30-600 MG 12hr tablet; Take 1 tablet by mouth 2 (two) times daily.

## 2018-02-25 NOTE — Patient Instructions (Signed)
Cough, Adult  Coughing is a reflex that clears your throat and your airways. Coughing helps to heal and protect your lungs. It is normal to cough occasionally, but a cough that happens with other symptoms or lasts a long time may be a sign of a condition that needs treatment. A cough may last only 2-3 weeks (acute), or it may last longer than 8 weeks (chronic).  What are the causes?  Coughing is commonly caused by:   Breathing in substances that irritate your lungs.   A viral or bacterial respiratory infection.   Allergies.   Asthma.   Postnasal drip.   Smoking.   Acid backing up from the stomach into the esophagus (gastroesophageal reflux).   Certain medicines.   Chronic lung problems, including COPD (or rarely, lung cancer).   Other medical conditions such as heart failure.    Follow these instructions at home:  Pay attention to any changes in your symptoms. Take these actions to help with your discomfort:   Take medicines only as told by your health care provider.  ? If you were prescribed an antibiotic medicine, take it as told by your health care provider. Do not stop taking the antibiotic even if you start to feel better.  ? Talk with your health care provider before you take a cough suppressant medicine.   Drink enough fluid to keep your urine clear or pale yellow.   If the air is dry, use a cold steam vaporizer or humidifier in your bedroom or your home to help loosen secretions.   Avoid anything that causes you to cough at work or at home.   If your cough is worse at night, try sleeping in a semi-upright position.   Avoid cigarette smoke. If you smoke, quit smoking. If you need help quitting, ask your health care provider.   Avoid caffeine.   Avoid alcohol.   Rest as needed.    Contact a health care provider if:   You have new symptoms.   You cough up pus.   Your cough does not get better after 2-3 weeks, or your cough gets worse.   You cannot control your cough with suppressant  medicines and you are losing sleep.   You develop pain that is getting worse or pain that is not controlled with pain medicines.   You have a fever.   You have unexplained weight loss.   You have night sweats.  Get help right away if:   You cough up blood.   You have difficulty breathing.   Your heartbeat is very fast.  This information is not intended to replace advice given to you by your health care provider. Make sure you discuss any questions you have with your health care provider.  Document Released: 09/08/2010 Document Revised: 08/18/2015 Document Reviewed: 05/19/2014  Elsevier Interactive Patient Education  2018 [employer]

## 2018-02-28 ENCOUNTER — Other Ambulatory Visit: Payer: Self-pay | Admitting: Obstetrics and Gynecology

## 2018-02-28 MED ORDER — AMOXICILLIN-POT CLAVULANATE 875-125 MG PO TABS: 1.0000 | ORAL_TABLET | Freq: Two times a day (BID) | ORAL | 0 refills | Status: AC

## 2018-02-28 NOTE — Progress Notes (Signed)
Mr. Kyle Swanson was seen here on 12/3 and diagnosed with an URI. He called today saying that he continues to have right ear pain and swelling of his lymph nodes on the ride side of his neck. He continues to have facial pain. He does say that his cough has improved with the medication that was provided.   Per the providers note on 12/3 it is recommended that he starts antibiotics since his symptoms have been 9-10 days since onset. Discussed this with Mr. Kyle Swanson who agree's with this plan of care.  Rx: Augmentin 875 mg PO BID x 7 days Return to office on Sunday if symptoms are not starting to improve.    Noni Saupe I, NP 02/28/2018 8:32 AM

## 2018-03-20 DIAGNOSIS — M24812 Other specific joint derangements of left shoulder, not elsewhere classified: Secondary | ICD-10-CM | POA: Diagnosis not present

## 2018-03-20 DIAGNOSIS — M67912 Unspecified disorder of synovium and tendon, left shoulder: Secondary | ICD-10-CM | POA: Diagnosis not present

## 2018-03-20 DIAGNOSIS — M19212 Secondary osteoarthritis, left shoulder: Secondary | ICD-10-CM | POA: Diagnosis not present

## 2018-07-08 ENCOUNTER — Other Ambulatory Visit: Payer: Self-pay

## 2018-07-08 DIAGNOSIS — Z13220 Encounter for screening for lipoid disorders: Secondary | ICD-10-CM

## 2018-07-08 DIAGNOSIS — Z1329 Encounter for screening for other suspected endocrine disorder: Secondary | ICD-10-CM

## 2018-07-08 DIAGNOSIS — Z1389 Encounter for screening for other disorder: Secondary | ICD-10-CM

## 2018-07-08 DIAGNOSIS — Z130 Encounter for screening for diseases of the blood and blood-forming organs and certain disorders involving the immune mechanism: Secondary | ICD-10-CM

## 2018-07-08 DIAGNOSIS — Z13228 Encounter for screening for other metabolic disorders: Secondary | ICD-10-CM

## 2018-07-15 ENCOUNTER — Encounter: Payer: 59 | Admitting: Family Medicine

## 2018-08-26 ENCOUNTER — Encounter: Payer: Self-pay | Admitting: Family Medicine

## 2018-08-26 ENCOUNTER — Ambulatory Visit (INDEPENDENT_AMBULATORY_CARE_PROVIDER_SITE_OTHER): Payer: 59 | Admitting: Family Medicine

## 2018-08-26 ENCOUNTER — Other Ambulatory Visit: Payer: Self-pay

## 2018-08-26 VITALS — BP 124/74 | HR 81 | Temp 97.8°F | Resp 14 | Ht 74.0 in | Wt 200.0 lb

## 2018-08-26 DIAGNOSIS — Z0001 Encounter for general adult medical examination with abnormal findings: Secondary | ICD-10-CM | POA: Diagnosis not present

## 2018-08-26 DIAGNOSIS — R945 Abnormal results of liver function studies: Secondary | ICD-10-CM

## 2018-08-26 DIAGNOSIS — E785 Hyperlipidemia, unspecified: Secondary | ICD-10-CM

## 2018-08-26 DIAGNOSIS — Z0000 Encounter for general adult medical examination without abnormal findings: Secondary | ICD-10-CM

## 2018-08-26 DIAGNOSIS — Z1329 Encounter for screening for other suspected endocrine disorder: Secondary | ICD-10-CM

## 2018-08-26 DIAGNOSIS — R7989 Other specified abnormal findings of blood chemistry: Secondary | ICD-10-CM

## 2018-08-26 DIAGNOSIS — Z125 Encounter for screening for malignant neoplasm of prostate: Secondary | ICD-10-CM

## 2018-08-26 NOTE — Patient Instructions (Addendum)
Keep up the good work with exercise as that has made a significant difference in weight since last year.  I expect the cholesterol numbers to look better as well as liver test.  If still elevated we can discuss further testing or monitoring.  Thanks for coming in today.  Stay safe.   Keeping you healthy  Get these tests  Blood pressure- Have your blood pressure checked once a year by your healthcare provider.  Normal blood pressure is 120/80  Weight- Have your body mass index (BMI) calculated to screen for obesity.  BMI is a measure of body fat based on height and weight. You can also calculate your own BMI at [email or web address].  Cholesterol- Have your cholesterol checked every year.  Diabetes- Have your blood sugar checked regularly if you have high blood pressure, high cholesterol, have a family history of diabetes or if you are overweight.  Screening for Colon Cancer- Colonoscopy starting at age 25.  Screening may begin sooner depending on your family history and other health conditions. Follow up colonoscopy as directed by your Gastroenterologist.  Screening for Prostate Cancer- Both blood work (PSA) and a rectal exam help screen for Prostate Cancer.  Screening begins at age 55 with African-American men and at age 25 with Caucasian men.  Screening may begin sooner depending on your family history.  Take these medicines  Aspirin- One aspirin daily can help prevent Heart disease and Stroke.  Flu shot- Every fall.  Tetanus- Every 10 years.  Zostavax- Once after the age of 55 to prevent Shingles.  Pneumonia shot- Once after the age of 36; if you are younger than 49, ask your healthcare provider if you need a Pneumonia shot.  Take these steps  Don't smoke- If you do smoke, talk to your doctor about quitting.  For tips on how to quit, go to www.smokefree.gov or call 1-800-QUIT-NOW.  Be physically active- Exercise 5 days a week for at least 30 minutes.  If you are not  already physically active start slow and gradually work up to 30 minutes of moderate physical activity.  Examples of moderate activity include walking briskly, mowing the yard, dancing, swimming, bicycling, etc.  Eat a healthy diet- Eat a variety of healthy food such as fruits, vegetables, low fat milk, low fat cheese, yogurt, lean meant, poultry, fish, beans, tofu, etc. For more information go to www.thenutritionsource.org  Drink alcohol in moderation- Limit alcohol intake to less than two drinks a day. Never drink and drive.  Dentist- Brush and floss twice daily; visit your dentist twice a year.  Depression- Your emotional health is as important as your physical health. If you're feeling down, or losing interest in things you would normally enjoy please talk to your healthcare provider.  Eye exam- Visit your eye doctor every year.  Safe sex- If you may be exposed to a sexually transmitted infection, use a condom.  Seat belts- Seat belts can save your life; always wear one.  Smoke/Carbon Monoxide detectors- These detectors need to be installed on the appropriate level of your home.  Replace batteries at least once a year.  Skin cancer- When out in the sun, cover up and use sunscreen 15 SPF or higher.  Violence- If anyone is threatening you, please tell your healthcare provider.  Living Will/ Health care power of attorney- Speak with your healthcare provider and family.  If you have lab work done today you will be contacted with your lab results within the next 2 weeks.  If  you have not heard from [place] then please contact us. The fastest way to get your results is to register for My Chart.   IF you received an x-ray today, you will receive an invoice from [facility]. Please contact [facility] at [phone] with questions or concerns regarding your invoice.   IF you received labwork today, you will receive an invoice from [facility]. Please contact [facility] at  [phone] with questions or concerns regarding your invoice.   Our billing staff will not be able to assist you with questions regarding bills from these companies.  You will be contacted with the lab results as soon as they are available. The fastest way to get your results is to activate your My Chart account. Instructions are located on the last page of this paperwork. If you have not heard from [place] regarding the results in 2 weeks, please contact this office.

## 2018-08-26 NOTE — Progress Notes (Signed)
Subjective:    Patient ID: Kyle Swanson, male    DOB: 03/12/1966, 53 y.o.   MRN: [id number]  HPI Kyle Swanson is a 53 y.o. male Presents today for: Chief Complaint  Patient presents with  . Annual Exam    patient is doing well with no complaints. Nol other issues at this time   History of allergic rhinitis, has used Flonase, Claritin over-the-counter.  Hyperlipidemia:  Lab Results  Component Value Date   CHOL 210 (H) 06/27/2017   HDL 53 06/27/2017   LDLCALC 133 (H) 06/27/2017   TRIG 121 06/27/2017   CHOLHDL 4.0 06/27/2017   Lab Results  Component Value Date   ALT 71 (H) 06/27/2017   AST 35 06/27/2017   ALKPHOS 80 06/27/2017   BILITOT 0.7 06/27/2017   Discussed that physical last year, elevated but based on 10-year ASCVD risk, defer medication at that time.  Recommended to watch diet, exercise, repeat testing in 6 months. Has lost 12 pounds since August 2019.increased exercise in December. 3 times per week. Group training session. Decreased exercise since Covid, but increasing again.   Elevated liver test as above.  Has cut back on alcohol - 2-3 beers once per week.   The 10-year ASCVD risk score Kyle Swanson DC Brooke Bonito., et al., 2013) is: 3.6%   Values used to calculate the score:     Age: 52 years     Sex: Male     Is Non-Hispanic African American: No     Diabetic: No     Tobacco smoker: No     Systolic Blood Pressure: 361 mmHg     Is BP treated: No     HDL Cholesterol: 53 mg/dL     Total Cholesterol: 210 mg/dL Wt Readings from Last 3 Encounters:  08/26/18 200 lb (90.7 kg)  02/25/18 210 lb 6.4 oz (95.4 kg)  10/31/17 212 lb (96.2 kg)   Cancer screening: Colonoscopy 10/31/2017, repeat 5 years Prostate: PSA 1.1 in April 2019.  Testing discussed.  Agrees on testing.  Immunization History  Administered Date(s) Administered  . Influenza, Seasonal, Injecte, Preservative Fre 04/28/2012  . Influenza-Unspecified 12/25/2014, 01/19/2016, 01/04/2017, 12/07/2017  . Tdap  04/28/2012  . Zoster Recombinat (Shingrix) 12/07/2017   Depression screen [facility] 2/9 06/27/2017 08/09/2016 08/01/2016 06/18/2016 01/31/2015  Decreased Interest 0 0 0 0 0  Down, Depressed, Hopeless 0 0 0 0 0  PHQ - 2 Score 0 0 0 0 0     Visual Acuity Screening   Right eye Left eye Both eyes  Without correction:     With correction: 20/20 20/20 20/20   last optho eval in December.   Dentist: in 2 weeks, appt every 1mo.   Exercise: As above - 3 days per week.    Patient Active Problem List   Diagnosis Date Noted  . General weakness 08/09/2016  . Viral illness 08/09/2016  . Palpitations 01/05/2013  . Allergy   . Asthma   . Allergic rhinitis 04/28/2012  . Routine health maintenance 09/06/2011   Past Medical History:  Diagnosis Date  . Allergic rhinitis 04/28/2012  . Allergy   . Anxiety   . Asthma    as a child - no meds or inhalers now   Past Surgical History:  Procedure Laterality Date  . GANGLION CYST EXCISION     left wrist 10 years ago  . TONSILLECTOMY AND ADENOIDECTOMY     childhood   Allergies  Allergen Reactions  . Sulfa Antibiotics Rash   Prior to Admission  medications   Medication Sig Start Date End Date Taking? Authorizing Provider  cholecalciferol (VITAMIN D) 1000 units tablet Take 1,000 Units by mouth daily.   Yes [provider]  fluticasone (FLONASE) 50 MCG/ACT nasal spray Place into both nostrils daily.   Yes [provider]  hydrocortisone cream 1 % Apply 1 application topically 2 (two) times daily.   Yes [provider]  loratadine (CLARITIN) 10 MG tablet Take 10 mg by mouth daily.   Yes [provider]  metroNIDAZOLE (METROCREAM) 0.75 % cream Apply topically 2 (two) times daily.   Yes [provider]  vitamin B-12 (CYANOCOBALAMIN) 100 MCG tablet Take 100 mcg by mouth daily.   Yes [provider]   Social History   Socioeconomic History  . Marital status: Divorced    Spouse name: Not on file  . Number  of children: Not on file  . Years of education: Not on file  . Highest education level: Not on file  Occupational History  . Occupation: Neurosurgeon  Social Needs  . Financial resource strain: Not on file  . Food insecurity:    Worry: Not on file    Inability: Not on file  . Transportation needs:    Medical: Not on file    Non-medical: Not on file  Tobacco Use  . Smoking status: Never Smoker  . Smokeless tobacco: Never Used  Substance and Sexual Activity  . Alcohol use: Yes    Alcohol/week: 4.0 - 5.0 standard drinks    Types: 4 - 5 Cans of beer per week    Comment: BEER  . Drug use: No  . Sexual activity: Yes    Birth control/protection: Condom, I.U.D.    Comment: number of sex partners in the last 13 months - 4  Lifestyle  . Physical activity:    Days per week: Not on file    Minutes per session: Not on file  . Stress: Not on file  Relationships  . Social connections:    Talks on phone: Not on file    Gets together: Not on file    Attends religious service: Not on file    Active member of club or organization: Not on file    Attends meetings of clubs or organizations: Not on file    Relationship status: Not on file  . Intimate partner violence:    Fear of current or ex partner: Not on file    Emotionally abused: Not on file    Physically abused: Not on file    Forced sexual activity: Not on file  Other Topics Concern  . Not on file  Social History Narrative   Divorced. Education: [employer]. Exercise: No.    Review of Systems 13 point review of systems per patient health survey noted.  Negative other than as indicated above or in HPI.      Objective:   Physical Exam Vitals signs reviewed.  Constitutional:      Appearance: He is well-developed.  HENT:     Head: Normocephalic and atraumatic.     Right Ear: External ear normal.     Left Ear: External ear normal.  Eyes:     Conjunctiva/sclera: Conjunctivae normal.     Pupils: Pupils are equal, round,  and reactive to light.  Neck:     Musculoskeletal: Normal range of motion and neck supple.     Thyroid: No thyromegaly.  Cardiovascular:     Rate and Rhythm: Normal rate and regular rhythm.  Heart sounds: Normal heart sounds.  Pulmonary:     Effort: Pulmonary effort is normal. No respiratory distress.     Breath sounds: Normal breath sounds. No wheezing.  Abdominal:     General: There is no distension.     Palpations: Abdomen is soft.     Tenderness: There is no abdominal tenderness.     Hernia: There is no hernia in the right inguinal area or left inguinal area.  Genitourinary:    Prostate: Normal.  Musculoskeletal: Normal range of motion.        General: No tenderness.  Lymphadenopathy:     Cervical: No cervical adenopathy.  Skin:    General: Skin is warm and dry.  Neurological:     Mental Status: He is alert and oriented to person, place, and time.     Deep Tendon Reflexes: Reflexes are normal and symmetric.  Psychiatric:        Behavior: Behavior normal.    Vitals:   08/26/18 0808  BP: 124/74  Pulse: 81  Resp: 14  Temp: 97.8 F (36.6 C)  TempSrc: Oral  SpO2: 95%  Weight: 200 lb (90.7 kg)  Height: 6\' 2"  (1.88 m)       Assessment & Plan:  Marl Seago is a 53 y.o. male Annual physical exam  - -anticipatory guidance as below in AVS, screening labs above. Health maintenance items as above in HPI discussed/recommended as applicable.   Screening for thyroid disorder - Plan: TSH  Hyperlipidemia, unspecified hyperlipidemia type - Plan: Lipid panel, Comprehensive metabolic panel LFT elevation - Plan: Comprehensive metabolic panel  -Anticipate improvement in both lipids as well as LFT with weight loss/exercise, and decrease in alcohol.   Repeat testing, and then determine follow-up, otherwise repeat physical in 1 year  Screening for prostate cancer - Plan: PSA  - We discussed pros and cons of prostate cancer screening, and after this discussion, he chose to have  screening done. PSA obtained, and no concerning findings on DRE.    No orders of the defined types were placed in this encounter.  Patient Instructions       If you have lab work done today you will be contacted with your lab results within the next 2 weeks.  If you have not heard from [place] then please contact us. The fastest way to get your results is to register for My Chart.   IF you received an x-ray today, you will receive an invoice from [facility]. Please contact [facility] at [phone] with questions or concerns regarding your invoice.   IF you received labwork today, you will receive an invoice from [place]. Please contact [facility] at [phone] with questions or concerns regarding your invoice.   Our billing staff will not be able to assist you with questions regarding bills from these companies.  You will be contacted with the lab results as soon as they are available. The fastest way to get your results is to activate your My Chart account. Instructions are located on the last page of this paperwork. If you have not heard from [place] regarding the results in 2 weeks, please contact this office.       Signed,   [name], MD [facility].  08/26/18 9:06 AM

## 2018-08-27 LAB — COMPREHENSIVE METABOLIC PANEL
ALT: 23 IU/L (ref 0–44)
AST: 20 IU/L (ref 0–40)
Albumin/Globulin Ratio: 2.4 — ABNORMAL HIGH (ref 1.2–2.2)
Albumin: 4.5 g/dL (ref 3.8–4.9)
Alkaline Phosphatase: 83 IU/L (ref 39–117)
BUN/Creatinine Ratio: 24 — ABNORMAL HIGH (ref 9–20)
BUN: 21 mg/dL (ref 6–24)
Bilirubin Total: 0.7 mg/dL (ref 0.0–1.2)
CO2: 20 mmol/L (ref 20–29)
Calcium: 9 mg/dL (ref 8.7–10.2)
Chloride: 102 mmol/L (ref 96–106)
Creatinine, Ser: 0.88 mg/dL (ref 0.76–1.27)
GFR calc Af Amer: 115 mL/min/{1.73_m2} (ref >59)
GFR calc non Af Amer: 99 mL/min/{1.73_m2} (ref >59)
Globulin, Total: 1.9 g/dL (ref 1.5–4.5)
Glucose: 88 mg/dL (ref 65–99)
Potassium: 4.1 mmol/L (ref 3.5–5.2)
Sodium: 140 mmol/L (ref 134–144)
Total Protein: 6.4 g/dL (ref 6.0–8.5)

## 2018-08-27 LAB — LIPID PANEL
Chol/HDL Ratio: 3.1 ratio (ref 0.0–5.0)
Cholesterol, Total: 180 mg/dL (ref 100–199)
HDL: 59 mg/dL (ref >39)
LDL Calculated: 112 mg/dL — ABNORMAL HIGH (ref 0–99)
Triglycerides: 47 mg/dL (ref 0–149)
VLDL Cholesterol Cal: 9 mg/dL (ref 5–40)

## 2018-08-27 LAB — TSH: TSH: 3.2 u[IU]/mL (ref 0.450–4.500)

## 2018-08-27 LAB — PSA: Prostate Specific Ag, Serum: 0.8 ng/mL (ref 0.0–4.0)

## 2019-01-02 ENCOUNTER — Ambulatory Visit (INDEPENDENT_AMBULATORY_CARE_PROVIDER_SITE_OTHER): Payer: 59 | Admitting: Family Medicine

## 2019-01-02 ENCOUNTER — Ambulatory Visit
Admission: RE | Admit: 2019-01-02 | Discharge: 2019-01-02 | Disposition: A | Discharge status: Home / Self Care | Payer: 59 | Source: Ambulatory Visit | Attending: Family Medicine | Admitting: Family Medicine

## 2019-01-02 ENCOUNTER — Encounter: Payer: Self-pay | Admitting: Family Medicine

## 2019-01-02 ENCOUNTER — Other Ambulatory Visit: Payer: Self-pay

## 2019-01-02 VITALS — BP 116/79 | HR 74 | Temp 98.6°F | Wt 185.0 lb

## 2019-01-02 DIAGNOSIS — R3915 Urgency of urination: Secondary | ICD-10-CM

## 2019-01-02 DIAGNOSIS — R350 Frequency of micturition: Secondary | ICD-10-CM | POA: Diagnosis not present

## 2019-01-02 DIAGNOSIS — R1084 Generalized abdominal pain: Secondary | ICD-10-CM

## 2019-01-02 DIAGNOSIS — M25551 Pain in right hip: Secondary | ICD-10-CM

## 2019-01-02 LAB — POC MICROSCOPIC URINALYSIS (UMFC): Mucus: ABSENT

## 2019-01-02 LAB — POCT CBC
Granulocyte percent: 57.2 %G (ref 37–80)
HCT, POC: 45.9 % — AB (ref 29–41)
Hemoglobin: 15.6 g/dL — AB (ref 11–14.6)
Lymph, poc: 1.8 (ref 0.6–3.4)
MCH, POC: 30.4 pg (ref 27–31.2)
MCHC: 34 g/dL (ref 31.8–35.4)
MCV: 89.4 fL (ref 76–111)
MID (cbc): 0.4 (ref 0–0.9)
MPV: 7.1 fL (ref 0–99.8)
POC Granulocyte: 2.9 (ref 2–6.9)
POC LYMPH PERCENT: 35.2 %L (ref 10–50)
POC MID %: 7.6 %M (ref 0–12)
Platelet Count, POC: 184 10*3/uL (ref 142–424)
RBC: 5.13 M/uL (ref 4.69–6.13)
RDW, POC: 13.2 %
WBC: 5.1 10*3/uL (ref 4.6–10.2)

## 2019-01-02 LAB — POCT URINALYSIS DIP (MANUAL ENTRY)
Bilirubin, UA: NEGATIVE
Blood, UA: NEGATIVE
Glucose, UA: NEGATIVE mg/dL
Ketones, POC UA: NEGATIVE mg/dL
Leukocytes, UA: NEGATIVE
Nitrite, UA: NEGATIVE
Protein Ur, POC: NEGATIVE mg/dL
Spec Grav, UA: 1.025 (ref 1.010–1.025)
Urobilinogen, UA: 1 E.U./dL
pH, UA: 7.5 (ref 5.0–8.0)

## 2019-01-02 NOTE — Patient Instructions (Addendum)
See info on urinary frequency. I will refer you to urology.   Hip pain may be a mild case of bursitis. Ice over area as needed, stretches, tylenol if needed. Recheck in next 4-6 weeks if not improved. Sooner if worse.   Fiber and water in diet for regular bowel movements. citrucel if needed for fiber. miralax if constipated.   Xray ordered for [facility] imaging on [address]. Have that done today. If any concerns on xray or labs I will let you know. recheck in 1 week for abdominal pain, sooner if worse.     Abdominal Pain, Adult Abdominal pain can be caused by many things. Often, abdominal pain is not serious and it gets better with no treatment or by being treated at home. However, sometimes abdominal pain is serious. Your health care provider will do a medical history and a physical exam to try to determine the cause of your abdominal pain. Follow these instructions at home:  Take over-the-counter and prescription medicines only as told by your health care provider. Do not take a laxative unless told by your health care provider.  Drink enough fluid to keep your urine clear or pale yellow.  Watch your condition for any changes.  Keep all follow-up visits as told by your health care provider. This is important. Contact a health care provider if:  Your abdominal pain changes or gets worse.  You are not hungry or you lose weight without trying.  You are constipated or have diarrhea for more than 2-3 days.  You have pain when you urinate or have a bowel movement.  Your abdominal pain wakes you up at night.  Your pain gets worse with meals, after eating, or with certain foods.  You are throwing up and cannot keep anything down.  You have a fever. Get help right away if:  Your pain does not go away as soon as your health care provider told you to expect.  You cannot stop throwing up.  Your pain is only in areas of the abdomen, such as the right side or the left lower  portion of the abdomen.  You have bloody or black stools, or stools that look like tar.  You have severe pain, cramping, or bloating in your abdomen.  You have signs of dehydration, such as: ? Dark urine, very little urine, or no urine. ? Cracked lips. ? Dry mouth. ? Sunken eyes. ? Sleepiness. ? Weakness. This information is not intended to replace advice given to you by your health care provider. Make sure you discuss any questions you have with your health care provider. Document Released: 12/20/2004 Document Revised: 09/30/2015 Document Reviewed: 08/24/2015 Elsevier Interactive Patient Education  [place].    Urinary Frequency, Adult Urinary frequency means urinating more often than usual. You may urinate every 1-2 hours even though you drink a normal amount of fluid and do not have a bladder infection or condition. Although you urinate more often than normal, the total amount of urine produced in a day is normal. With urinary frequency, you may have an urgent need to urinate often. The stress and anxiety of needing to find a bathroom quickly can make this urge worse. This condition may go away on its own or you may need treatment at home. Home treatment may include bladder training, exercises, taking medicines, or making changes to your diet. Follow these instructions at home: Bladder health   Keep a bladder diary if told by your health care provider. Keep track  of: ? What you eat and drink. ? How often you urinate. ? How much you urinate.  Follow a bladder training program if told by your health care provider. This may include: ? Learning to delay going to the bathroom. ? Double urinating (voiding). This helps if you are not completely emptying your bladder. ? Scheduled voiding.  Do Kegel exercises as told by your health care provider. Kegel exercises strengthen the muscles that help control urination, which may help the condition. Eating and drinking  If told by  your health care provider, make diet changes, such as: ? Avoiding caffeine. ? Drinking fewer fluids, especially alcohol. ? Not drinking in the evening. ? Avoiding foods or drinks that may irritate the bladder. These include coffee, tea, soda, artificial sweeteners, citrus, tomato-based foods, and chocolate. ? Eating foods that help prevent or ease constipation. Constipation can make this condition worse. Your health care provider may recommend that you:  Drink enough fluid to keep your urine pale yellow.  Take over-the-counter or prescription medicines.  Eat foods that are high in fiber, such as beans, whole grains, and fresh fruits and vegetables.  Limit foods that are high in fat and processed sugars, such as fried or sweet foods. General instructions  Take over-the-counter and prescription medicines only as told by your health care provider.  Keep all follow-up visits as told by your health care provider. This is important. Contact a health care provider if:  You start urinating more often.  You feel pain or irritation when you urinate.  You notice blood in your urine.  Your urine looks cloudy.  You develop a fever.  You begin vomiting. Get help right away if:  You are unable to urinate. Summary  Urinary frequency means urinating more often than usual. With urinary frequency, you may urinate every 1-2 hours even though you drink a normal amount of fluid and do not have a bladder infection or other bladder condition.  Your health care provider may recommend that you keep a bladder diary, follow a bladder training program, or make dietary changes.  If told by your health care provider, do Kegel exercises to strengthen the muscles that help control urination.  Take over-the-counter and prescription medicines only as told by your health care provider.  Contact a health care provider if your symptoms do not improve or get worse. This information is not intended to replace  advice given to you by your health care provider. Make sure you discuss any questions you have with your health care provider. Document Released: 01/06/2009 Document Revised: 09/19/2017 Document Reviewed: 09/19/2017 Elsevier Patient Education  2020 [place].   Hip Bursitis  Hip bursitis is inflammation of a fluid-filled sac (bursa) in the hip joint. The bursa prevents the bones in the hip joint from rubbing against each other. Hip bursitis can cause mild to moderate pain, and symptoms often come and go over time. What are the causes? This condition may be caused by:  Injury to the hip.  Overuse of the muscles that surround the hip joint.  Previous injury or surgery of the hip.  Arthritis or gout.  Diabetes.  Thyroid disease.  Infection. In some cases, the cause may not be known. What are the signs or symptoms? Symptoms of this condition include:  Mild or moderate pain in the hip area. Pain may get worse with movement.  Tenderness and swelling of the hip, especially on the outer side of the hip.  In rare cases, the bursa may become  infected. This may cause a fever, as well as warmth and redness in the area. Symptoms may come and go. How is this diagnosed? This condition may be diagnosed based on:  A physical exam.  Your medical history.  X-rays.  Removal of fluid from your inflamed bursa for testing (biopsy). You may be sent to a health care provider who specializes in bone diseases (orthopedist) or a provider who specializes in joint inflammation (rheumatologist). How is this treated? This condition is treated by resting, icing, applying pressure (compression), and raising (elevating) the injured area. This is called RICE treatment. In some cases, this may be enough to make your symptoms go away. Treatment may also include:  Using crutches.  Draining fluid out of the bursa to help relieve swelling.  Injecting medicine that helps to reduce inflammation  (cortisone).  Additional medicines if the bursa is infected. Follow these instructions at home: Managing pain, stiffness, and swelling   If directed, put ice on the painful area. ? Put ice in a plastic bag. ? Place a towel between your skin and the bag. ? Leave the ice on for 20 minutes, 2-3 times a day. ? Raise (elevate) your hip above the level of your heart as much as you can without pain. To do this, try putting a pillow under your hips while you lie down. Activity  Return to your normal activities as told by your health care provider. Ask your health care provider what activities are safe for you.  Rest and protect your hip as much as possible until your pain and swelling get better. General instructions  Take over-the-counter and prescription medicines only as told by your health care provider.  Wear compression wraps only as told by your health care provider.  Do not use your hip to support your body weight until your health care provider says that you can. Use crutches as told by your health care provider.  Gently massage and stretch your injured area as often as is comfortable.  Keep all follow-up visits as told by your health care provider. This is important. How is this prevented?  Exercise regularly, as told by your health care provider.  Warm up and stretch before being active.  Cool down and stretch after being active.  If an activity irritates your hip or causes pain, avoid the activity as much as possible.  Avoid sitting down for long periods at a time. Contact a health care provider if you:  Have a fever.  Develop new symptoms.  Have difficulty walking or doing everyday activities.  Have pain that gets worse or does not get better with medicine.  Develop red skin or a feeling of warmth in your hip area. Get help right away if you:  Cannot move your hip.  Have severe pain. Summary  Hip bursitis is inflammation of a fluid-filled sac (bursa) in  the hip joint.  Hip bursitis can cause mild to moderate pain, and symptoms often come and go over time.  This condition is treated with rest, ice, compression, elevation, and medicines. This information is not intended to replace advice given to you by your health care provider. Make sure you discuss any questions you have with your health care provider. Document Released: 09/01/2001 Document Revised: 11/18/2017 Document Reviewed: 11/18/2017 Elsevier Patient Education  [employer].     If you have lab work done today you will be contacted with your lab results within the next 2 weeks.  If you have not heard from  [place] then please contact us. The fastest way to get your results is to register for My Chart.   IF you received an x-ray today, you will receive an invoice from [facility]. Please contact [facility] at [phone] with questions or concerns regarding your invoice.   IF you received labwork today, you will receive an invoice from [place]. Please contact [facility] at [phone] with questions or concerns regarding your invoice.   Our billing staff will not be able to assist you with questions regarding bills from these companies.  You will be contacted with the lab results as soon as they are available. The fastest way to get your results is to activate your My Chart account. Instructions are located on the last page of this paperwork. If you have not heard from [place] regarding the results in 2 weeks, please contact this office.

## 2019-01-02 NOTE — Progress Notes (Signed)
Subjective:    Patient ID: Kyle Swanson, male    DOB: 11-23-1965, 53 y.o.   MRN: [id number]  HPI Kyle Swanson is a 53 y.o. male Presents today for: Chief Complaint  Patient presents with  . Abdominal Pain    Stomach ache for 3-4 weeks that will not go away. aches last for 30-45 min a day after meal. Feel bloated all the time. Frequency in urination is still the same that has not changed.   . Hip Pain    Hip pain has been going on for a long time with some tenderness on the rt side   Abdominal pain: Stomachache daily for past 4-6 weeks.  Mid abdomen.  No n/v/fever. No heartburn  BM QD, sometimes multiple per day if no BM day prior. Some hard stools, occasional straining.  Rare diarrhea, no blood in stools, sometimes dark stool.  No dysuria. Still some urinary frequency  Present for year or two.  Sometimes urgency past few weeks - not daily, more in stressful environments. More at work. No hematuria.  Some increased stress with work. Not overwhelmed. Not sure if associated with stomach.  Tx: pepto bismol, ginger tea and laxative  Normal PSA 0.8 on June 2  Hip pain: R hip pain. Outside of R hip. Sometimes in R sie of abdomen.  Hip pain past 32mo to a year. Walking ok, not limiting activity. Soreness mostly in am.  NKI, no new exercises.   Tx: none.    Patient Active Problem List   Diagnosis Date Noted  . General weakness 08/09/2016  . Viral illness 08/09/2016  . Palpitations 01/05/2013  . Allergy   . Asthma   . Allergic rhinitis 04/28/2012  . Routine health maintenance 09/06/2011   Past Medical History:  Diagnosis Date  . Allergic rhinitis 04/28/2012  . Allergy   . Anxiety   . Asthma    as a child - no meds or inhalers now   Past Surgical History:  Procedure Laterality Date  . GANGLION CYST EXCISION     left wrist 10 years ago  . TONSILLECTOMY AND ADENOIDECTOMY     childhood   Allergies  Allergen Reactions  . Sulfa Antibiotics Rash   Prior to Admission  medications   Medication Sig Start Date End Date Taking? Authorizing Provider  cholecalciferol (VITAMIN D) 1000 units tablet Take 1,000 Units by mouth daily.   Yes [provider]  fluticasone (FLONASE) 50 MCG/ACT nasal spray Place into both nostrils daily.   Yes [provider]  hydrocortisone cream 1 % Apply 1 application topically 2 (two) times daily.   Yes [provider]  loratadine (CLARITIN) 10 MG tablet Take 10 mg by mouth daily.   Yes [provider]  metroNIDAZOLE (METROCREAM) 0.75 % cream Apply topically 2 (two) times daily.   Yes [provider]  Omega-3 Fatty Acids (FISH OIL) 1000 MG CAPS Take by mouth.   Yes [provider]   Social History   Socioeconomic History  . Marital status: Divorced    Spouse name: Not on file  . Number of children: Not on file  . Years of education: Not on file  . Highest education level: Not on file  Occupational History  . Occupation: Neurosurgeon  Social Needs  . Financial resource strain: Not on file  . Food insecurity    Worry: Not on file    Inability: Not on file  . Transportation needs    Medical: Not on file  Non-medical: Not on file  Tobacco Use  . Smoking status: Never Smoker  . Smokeless tobacco: Never Used  Substance and Sexual Activity  . Alcohol use: Yes    Alcohol/week: 4.0 - 5.0 standard drinks    Types: 4 - 5 Cans of beer per week    Comment: BEER  . Drug use: No  . Sexual activity: Yes    Birth control/protection: Condom, I.U.D.    Comment: number of sex partners in the last 17 months - 4  Lifestyle  . Physical activity    Days per week: Not on file    Minutes per session: Not on file  . Stress: Not on file  Relationships  . Social Herbalist on phone: Not on file    Gets together: Not on file    Attends religious service: Not on file    Active member of club or organization: Not on file    Attends meetings of clubs or organizations: Not  on file    Relationship status: Not on file  . Intimate partner violence    Fear of current or ex partner: Not on file    Emotionally abused: Not on file    Physically abused: Not on file    Forced sexual activity: Not on file  Other Topics Concern  . Not on file  Social History Narrative   Divorced. Education: [employer]. Exercise: No.    Review of Systems Per HPI.     Objective:   Physical Exam Vitals signs reviewed.  Constitutional:      Appearance: He is well-developed.  HENT:     Head: Normocephalic and atraumatic.  Eyes:     Pupils: Pupils are equal, round, and reactive to light.  Neck:     Vascular: No carotid bruit or JVD.  Cardiovascular:     Rate and Rhythm: Normal rate and regular rhythm.     Heart sounds: Normal heart sounds. No murmur.  Pulmonary:     Effort: Pulmonary effort is normal.     Breath sounds: Normal breath sounds. No rales.  Abdominal:     Tenderness: There is abdominal tenderness (min R lateral abdomen only, no apparent hernia/defect, no CVAT. ). There is no right CVA tenderness, left CVA tenderness, guarding or rebound.  Musculoskeletal:     Right hip: He exhibits tenderness (min ttp over troch bursa only. ). He exhibits normal range of motion.  Skin:    General: Skin is warm and dry.  Neurological:     Mental Status: He is alert and oriented to person, place, and time.    Vitals:   01/02/19 0842  BP: 116/79  Pulse: 74  Temp: 98.6 F (37 C)  TempSrc: Oral  SpO2: 96%  Weight: 185 lb (83.9 kg)    Results for orders placed or performed in visit on 01/02/19  POCT CBC  Result Value Ref Range   WBC 5.1 4.6 - 10.2 K/uL   Lymph, poc 1.8 0.6 - 3.4   POC LYMPH PERCENT 35.2 10 - 50 %L   MID (cbc) 0.4 0 - 0.9   POC MID % 7.6 0 - 12 %M   POC Granulocyte 2.9 2 - 6.9   Granulocyte percent 57.2 37 - 80 %G   RBC 5.13 4.69 - 6.13 M/uL   Hemoglobin 15.6 (A) 11 - 14.6 g/dL   HCT, POC 45.9 (A) 29 - 41 %   MCV 89.4 76 - 111 fL   MCH, POC 30.4  27  - 31.2 pg   MCHC 34.0 31.8 - 35.4 g/dL   RDW, POC 13.2 %   Platelet Count, POC 184 142 - 424 K/uL   MPV 7.1 0 - 99.8 fL  POCT urinalysis dipstick  Result Value Ref Range   Color, UA yellow yellow   Clarity, UA clear clear   Glucose, UA negative negative mg/dL   Bilirubin, UA negative negative   Ketones, POC UA negative negative mg/dL   Spec Grav, UA 1.025 1.010 - 1.025   Blood, UA negative negative   pH, UA 7.5 5.0 - 8.0   Protein Ur, POC negative negative mg/dL   Urobilinogen, UA 1.0 0.2 or 1.0 E.U./dL   Nitrite, UA Negative Negative   Leukocytes, UA Negative Negative   Dg Abd 1 View  Result Date: 01/02/2019 CLINICAL DATA:  Abdominal pain. EXAM: ABDOMEN - 1 VIEW COMPARISON:  None. FINDINGS: The bowel gas pattern is normal. No radio-opaque calculi or other significant radiographic abnormality are seen. IMPRESSION: Benign-appearing abdomen. Electronically Signed   By: Lorriane Shire M.D.   On: 01/02/2019 12:18   Dg Hip Unilat W Or W/o Pelvis 2-3 Views Right  Result Date: 01/02/2019 CLINICAL DATA:  Right hip pain for 1 year. EXAM: DG HIP (WITH OR WITHOUT PELVIS) 2-3V RIGHT COMPARISON:  None. FINDINGS: There are tiny marginal osteophytes in the femoral head. Joint spaces preserved. Bones are otherwise normal. No abnormal soft tissue calcifications. IMPRESSION: Minimal degenerative changes of the right hip. Electronically Signed   By: Lorriane Shire M.D.   On: 01/02/2019 12:19         Assessment & Plan:   Kyle Swanson is a 53 y.o. male Abdominal pain, generalized - Plan: POCT CBC, Comprehensive metabolic panel, DG Abd 1 View, CANCELED: DG Abd 1 View  -Afebrile, reassuring CBC.  X-ray without significant bowel gas findings or significant stool burden.  Constipation possible,  bowel regimen discussed with preventative techniques.  Recheck 1 week, sooner if worse, ER precautions.  Right hip pain - Plan: DG HIP UNILAT W OR W/O PELVIS 2-3 VIEWS RIGHT, CANCELED: DG HIP UNILAT W OR W/O  PELVIS 2-3 VIEWS RIGHT  -Location appears to be trochanteric bursitis.  Mild degenerative changes noted on x-ray.  Symptomatic care and handout given initially.  Urinary urgency - Plan: POCT urinalysis dipstick, POCT Microscopic Urinalysis ([facility]), PSA, Ambulatory referral to Urology Urinary frequency - Plan: POCT urinalysis dipstick, POCT Microscopic Urinalysis ([facility]), PSA, Ambulatory referral to Urology  -Longstanding symptoms, reassuring urinalysis, eval with urology ordered.  Check PSA again, previously normal.  No orders of the defined types were placed in this encounter.  Patient Instructions   See info on urinary frequency. I will refer you to urology.   Hip pain may be a mild case of bursitis. Ice over area as needed, stretches, tylenol if needed. Recheck in next 4-6 weeks if not improved. Sooner if worse.   Fiber and water in diet for regular bowel movements. citrucel if needed for fiber. miralax if constipated.   Xray ordered for [facility] imaging on [address]. Have that done today. If any concerns on xray or labs I will let you know. recheck in 1 week for abdominal pain, sooner if worse.     Abdominal Pain, Adult Abdominal pain can be caused by many things. Often, abdominal pain is not serious and it gets better with no treatment or by being treated at home. However, sometimes abdominal pain is serious. Your health care provider will do a  medical history and a physical exam to try to determine the cause of your abdominal pain. Follow these instructions at home:  Take over-the-counter and prescription medicines only as told by your health care provider. Do not take a laxative unless told by your health care provider.  Drink enough fluid to keep your urine clear or pale yellow.  Watch your condition for any changes.  Keep all follow-up visits as told by your health care provider. This is important. Contact a health care provider if:  Your abdominal pain changes or  gets worse.  You are not hungry or you lose weight without trying.  You are constipated or have diarrhea for more than 2-3 days.  You have pain when you urinate or have a bowel movement.  Your abdominal pain wakes you up at night.  Your pain gets worse with meals, after eating, or with certain foods.  You are throwing up and cannot keep anything down.  You have a fever. Get help right away if:  Your pain does not go away as soon as your health care provider told you to expect.  You cannot stop throwing up.  Your pain is only in areas of the abdomen, such as the right side or the left lower portion of the abdomen.  You have bloody or black stools, or stools that look like tar.  You have severe pain, cramping, or bloating in your abdomen.  You have signs of dehydration, such as: ? Dark urine, very little urine, or no urine. ? Cracked lips. ? Dry mouth. ? Sunken eyes. ? Sleepiness. ? Weakness. This information is not intended to replace advice given to you by your health care provider. Make sure you discuss any questions you have with your health care provider. Document Released: 12/20/2004 Document Revised: 09/30/2015 Document Reviewed: 08/24/2015 Elsevier Interactive Patient Education  [place].    Urinary Frequency, Adult Urinary frequency means urinating more often than usual. You may urinate every 1-2 hours even though you drink a normal amount of fluid and do not have a bladder infection or condition. Although you urinate more often than normal, the total amount of urine produced in a day is normal. With urinary frequency, you may have an urgent need to urinate often. The stress and anxiety of needing to find a bathroom quickly can make this urge worse. This condition may go away on its own or you may need treatment at home. Home treatment may include bladder training, exercises, taking medicines, or making changes to your diet. Follow these instructions at  home: Bladder health   Keep a bladder diary if told by your health care provider. Keep track of: ? What you eat and drink. ? How often you urinate. ? How much you urinate.  Follow a bladder training program if told by your health care provider. This may include: ? Learning to delay going to the bathroom. ? Double urinating (voiding). This helps if you are not completely emptying your bladder. ? Scheduled voiding.  Do Kegel exercises as told by your health care provider. Kegel exercises strengthen the muscles that help control urination, which may help the condition. Eating and drinking  If told by your health care provider, make diet changes, such as: ? Avoiding caffeine. ? Drinking fewer fluids, especially alcohol. ? Not drinking in the evening. ? Avoiding foods or drinks that may irritate the bladder. These include coffee, tea, soda, artificial sweeteners, citrus, tomato-based foods, and chocolate. ? Eating foods that help prevent or  ease constipation. Constipation can make this condition worse. Your health care provider may recommend that you:  Drink enough fluid to keep your urine pale yellow.  Take over-the-counter or prescription medicines.  Eat foods that are high in fiber, such as beans, whole grains, and fresh fruits and vegetables.  Limit foods that are high in fat and processed sugars, such as fried or sweet foods. General instructions  Take over-the-counter and prescription medicines only as told by your health care provider.  Keep all follow-up visits as told by your health care provider. This is important. Contact a health care provider if:  You start urinating more often.  You feel pain or irritation when you urinate.  You notice blood in your urine.  Your urine looks cloudy.  You develop a fever.  You begin vomiting. Get help right away if:  You are unable to urinate. Summary  Urinary frequency means urinating more often than usual. With urinary  frequency, you may urinate every 1-2 hours even though you drink a normal amount of fluid and do not have a bladder infection or other bladder condition.  Your health care provider may recommend that you keep a bladder diary, follow a bladder training program, or make dietary changes.  If told by your health care provider, do Kegel exercises to strengthen the muscles that help control urination.  Take over-the-counter and prescription medicines only as told by your health care provider.  Contact a health care provider if your symptoms do not improve or get worse. This information is not intended to replace advice given to you by your health care provider. Make sure you discuss any questions you have with your health care provider. Document Released: 01/06/2009 Document Revised: 09/19/2017 Document Reviewed: 09/19/2017 Elsevier Patient Education  2020 [place].   Hip Bursitis  Hip bursitis is inflammation of a fluid-filled sac (bursa) in the hip joint. The bursa prevents the bones in the hip joint from rubbing against each other. Hip bursitis can cause mild to moderate pain, and symptoms often come and go over time. What are the causes? This condition may be caused by:  Injury to the hip.  Overuse of the muscles that surround the hip joint.  Previous injury or surgery of the hip.  Arthritis or gout.  Diabetes.  Thyroid disease.  Infection. In some cases, the cause may not be known. What are the signs or symptoms? Symptoms of this condition include:  Mild or moderate pain in the hip area. Pain may get worse with movement.  Tenderness and swelling of the hip, especially on the outer side of the hip.  In rare cases, the bursa may become infected. This may cause a fever, as well as warmth and redness in the area. Symptoms may come and go. How is this diagnosed? This condition may be diagnosed based on:  A physical exam.  Your medical history.  X-rays.  Removal of  fluid from your inflamed bursa for testing (biopsy). You may be sent to a health care provider who specializes in bone diseases (orthopedist) or a provider who specializes in joint inflammation (rheumatologist). How is this treated? This condition is treated by resting, icing, applying pressure (compression), and raising (elevating) the injured area. This is called RICE treatment. In some cases, this may be enough to make your symptoms go away. Treatment may also include:  Using crutches.  Draining fluid out of the bursa to help relieve swelling.  Injecting medicine that helps to reduce inflammation (cortisone).  Additional  medicines if the bursa is infected. Follow these instructions at home: Managing pain, stiffness, and swelling   If directed, put ice on the painful area. ? Put ice in a plastic bag. ? Place a towel between your skin and the bag. ? Leave the ice on for 20 minutes, 2-3 times a day. ? Raise (elevate) your hip above the level of your heart as much as you can without pain. To do this, try putting a pillow under your hips while you lie down. Activity  Return to your normal activities as told by your health care provider. Ask your health care provider what activities are safe for you.  Rest and protect your hip as much as possible until your pain and swelling get better. General instructions  Take over-the-counter and prescription medicines only as told by your health care provider.  Wear compression wraps only as told by your health care provider.  Do not use your hip to support your body weight until your health care provider says that you can. Use crutches as told by your health care provider.  Gently massage and stretch your injured area as often as is comfortable.  Keep all follow-up visits as told by your health care provider. This is important. How is this prevented?  Exercise regularly, as told by your health care provider.  Warm up and stretch before  being active.  Cool down and stretch after being active.  If an activity irritates your hip or causes pain, avoid the activity as much as possible.  Avoid sitting down for long periods at a time. Contact a health care provider if you:  Have a fever.  Develop new symptoms.  Have difficulty walking or doing everyday activities.  Have pain that gets worse or does not get better with medicine.  Develop red skin or a feeling of warmth in your hip area. Get help right away if you:  Cannot move your hip.  Have severe pain. Summary  Hip bursitis is inflammation of a fluid-filled sac (bursa) in the hip joint.  Hip bursitis can cause mild to moderate pain, and symptoms often come and go over time.  This condition is treated with rest, ice, compression, elevation, and medicines. This information is not intended to replace advice given to you by your health care provider. Make sure you discuss any questions you have with your health care provider. Document Released: 09/01/2001 Document Revised: 11/18/2017 Document Reviewed: 11/18/2017 Elsevier Patient Education  [employer].     If you have lab work done today you will be contacted with your lab results within the next 2 weeks.  If you have not heard from [place] then please contact us. The fastest way to get your results is to register for My Chart.   IF you received an x-ray today, you will receive an invoice from [facility]. Please contact [facility] at [phone] with questions or concerns regarding your invoice.   IF you received labwork today, you will receive an invoice from [facility]. Please contact [facility] at [phone] with questions or concerns regarding your invoice.   Our billing staff will not be able to assist you with questions regarding bills from these companies.  You will be contacted with the lab results as soon as they are available. The fastest way to get your results is to activate  your My Chart account. Instructions are located on the last page of this paperwork. If you have not heard from [place] regarding the results in 2  weeks, please contact this office.       Signed,   [name], MD [facility].  01/04/19 9:12 AM

## 2019-01-03 LAB — COMPREHENSIVE METABOLIC PANEL
ALT: 20 IU/L (ref 0–44)
AST: 19 IU/L (ref 0–40)
Albumin/Globulin Ratio: 2 (ref 1.2–2.2)
Albumin: 4.5 g/dL (ref 3.8–4.9)
Alkaline Phosphatase: 87 IU/L (ref 39–117)
BUN/Creatinine Ratio: 20 (ref 9–20)
BUN: 18 mg/dL (ref 6–24)
Bilirubin Total: 0.7 mg/dL (ref 0.0–1.2)
CO2: 23 mmol/L (ref 20–29)
Calcium: 9.3 mg/dL (ref 8.7–10.2)
Chloride: 103 mmol/L (ref 96–106)
Creatinine, Ser: 0.89 mg/dL (ref 0.76–1.27)
GFR calc Af Amer: 114 mL/min/{1.73_m2} (ref >59)
GFR calc non Af Amer: 99 mL/min/{1.73_m2} (ref >59)
Globulin, Total: 2.2 g/dL (ref 1.5–4.5)
Glucose: 95 mg/dL (ref 65–99)
Potassium: 4.4 mmol/L (ref 3.5–5.2)
Sodium: 139 mmol/L (ref 134–144)
Total Protein: 6.7 g/dL (ref 6.0–8.5)

## 2019-01-03 LAB — PSA: Prostate Specific Ag, Serum: 0.7 ng/mL (ref 0.0–4.0)

## 2019-01-04 ENCOUNTER — Encounter: Payer: Self-pay | Admitting: Family Medicine

## 2019-01-12 ENCOUNTER — Encounter: Payer: Self-pay | Admitting: Family Medicine

## 2019-01-12 ENCOUNTER — Other Ambulatory Visit: Payer: Self-pay

## 2019-01-12 ENCOUNTER — Ambulatory Visit (INDEPENDENT_AMBULATORY_CARE_PROVIDER_SITE_OTHER): Payer: 59 | Admitting: Family Medicine

## 2019-01-12 VITALS — BP 123/75 | HR 78 | Temp 98.5°F | Wt 187.0 lb

## 2019-01-12 DIAGNOSIS — R351 Nocturia: Secondary | ICD-10-CM

## 2019-01-12 DIAGNOSIS — R3915 Urgency of urination: Secondary | ICD-10-CM | POA: Diagnosis not present

## 2019-01-12 DIAGNOSIS — M25551 Pain in right hip: Secondary | ICD-10-CM

## 2019-01-12 DIAGNOSIS — R350 Frequency of micturition: Secondary | ICD-10-CM | POA: Diagnosis not present

## 2019-01-12 DIAGNOSIS — R1084 Generalized abdominal pain: Secondary | ICD-10-CM

## 2019-01-12 MED ORDER — DOXAZOSIN MESYLATE 1 MG PO TABS: 1.0000 mg | ORAL_TABLET | Freq: Every day | ORAL | 2 refills | Status: DC

## 2019-01-12 NOTE — Progress Notes (Signed)
Subjective:    Patient ID: Kyle Swanson, male    DOB: 1965/08/12, 53 y.o.   MRN: [id number]  HPI Kyle Swanson is a 53 y.o. male Presents today for: Chief Complaint  Patient presents with  . Follow-up    1 wk f/u on abd pain. Last few days has been better but not gone away   Abdominal pain: Discussed that October 9 visit, symptoms for 4 to 6 weeks at that time, mid abdomen, no nausea vomiting fever or heartburn symptoms.  Bowel movements every day symptoms multiple per day if no bowel movement day prior, occasional hard stools discussed at that time.  Did have some persistent urinary frequency.  1 view x-ray without significant bowel gas findings or significant stool burden, reassuring CBC.  Bowel regimen was discussed for possible constipation previously.  Urinalysis also reassuring, urology referral placed.  PSA normal, CMP normal, urinalysis and CBC normal. Colonoscopy in 2019, single polyp. No diverticulosis.  No night sweats/wt loss.  No recent dark stools - had prior, only few episodes dark stools.   Has felt better past 2-3 days. Has increased fiber in diet. Mostly regular.  No fever. No n/v. Eating drinking ok, no heartburn.  No pain past 2 days. Has not received call yet from urology. Still some urinary frequency/urgency. nocturia 3-4 times last night. Occasional daytime urgency. IPSS score 18, with score 5 of quality of life.   R hip pain:  See last ov. Min degenerative changes, thought to be trochanteric bursitis  Few years. No radiating leg pain.  Prior injection at [place] for back. Dr. Berenice Primas, or Rip Harbour in past. Tx: none -minimal nsaid - less than once per week.   Patient Active Problem List   Diagnosis Date Noted  . General weakness 08/09/2016  . Viral illness 08/09/2016  . Palpitations 01/05/2013  . Allergy   . Asthma   . Allergic rhinitis 04/28/2012  . Routine health maintenance 09/06/2011   Past Medical History:  Diagnosis Date  . Allergic  rhinitis 04/28/2012  . Allergy   . Anxiety   . Asthma    as a child - no meds or inhalers now   Past Surgical History:  Procedure Laterality Date  . GANGLION CYST EXCISION     left wrist 10 years ago  . TONSILLECTOMY AND ADENOIDECTOMY     childhood   Allergies  Allergen Reactions  . Sulfa Antibiotics Rash   Prior to Admission medications   Medication Sig Start Date End Date Taking? Authorizing Provider  Calcium Polycarbophil (FIBER-CAPS PO) Take by mouth. [employer] brand fiber   Yes [provider]  cholecalciferol (VITAMIN D) 1000 units tablet Take 1,000 Units by mouth daily.   Yes [provider]  fluticasone (FLONASE) 50 MCG/ACT nasal spray Place into both nostrils daily.   Yes [provider]  hydrocortisone cream 1 % Apply 1 application topically 2 (two) times daily.   Yes [provider]  loratadine (CLARITIN) 10 MG tablet Take 10 mg by mouth daily.   Yes [provider]  metroNIDAZOLE (METROCREAM) 0.75 % cream Apply topically 2 (two) times daily.   Yes [provider]  Omega-3 Fatty Acids (FISH OIL) 1000 MG CAPS Take by mouth.   Yes [provider]   Social History   Socioeconomic History  . Marital status: Divorced    Spouse name: Not on file  . Number of children: Not on file  . Years of education: Not on file  . Highest education  level: Not on file  Occupational History  . Occupation: Neurosurgeon  Social Needs  . Financial resource strain: Not on file  . Food insecurity    Worry: Not on file    Inability: Not on file  . Transportation needs    Medical: Not on file    Non-medical: Not on file  Tobacco Use  . Smoking status: Never Smoker  . Smokeless tobacco: Never Used  Substance and Sexual Activity  . Alcohol use: Yes    Alcohol/week: 4.0 - 5.0 standard drinks    Types: 4 - 5 Cans of beer per week    Comment: BEER  . Drug use: No  . Sexual activity: Yes    Birth control/protection:  Condom, I.U.D.    Comment: number of sex partners in the last 64 months - 4  Lifestyle  . Physical activity    Days per week: Not on file    Minutes per session: Not on file  . Stress: Not on file  Relationships  . Social Herbalist on phone: Not on file    Gets together: Not on file    Attends religious service: Not on file    Active member of club or organization: Not on file    Attends meetings of clubs or organizations: Not on file    Relationship status: Not on file  . Intimate partner violence    Fear of current or ex partner: Not on file    Emotionally abused: Not on file    Physically abused: Not on file    Forced sexual activity: Not on file  Other Topics Concern  . Not on file  Social History Narrative   Divorced. Education: [employer]. Exercise: No.    Review of Systems Per HPI.     Objective:   Physical Exam Vitals signs reviewed.  Constitutional:      Appearance: He is well-developed.  HENT:     Head: Normocephalic and atraumatic.  Eyes:     Pupils: Pupils are equal, round, and reactive to light.  Neck:     Vascular: No carotid bruit or JVD.  Cardiovascular:     Rate and Rhythm: Normal rate and regular rhythm.     Heart sounds: Normal heart sounds. No murmur.  Pulmonary:     Effort: Pulmonary effort is normal.     Breath sounds: Normal breath sounds. No rales.  Abdominal:     General: Abdomen is flat. Bowel sounds are normal. There is no distension.     Palpations: There is no mass.     Tenderness: There is no abdominal tenderness. There is no guarding or rebound.  Musculoskeletal:     Right hip: He exhibits tenderness (troch bursa, and posterior soft tissue. no focal bony ttp, sciatic notch nt. pain free IR/ER. ). He exhibits normal range of motion and normal strength.  Skin:    General: Skin is warm and dry.  Neurological:     Mental Status: He is alert and oriented to person, place, and time.    Vitals:   01/12/19 0916  BP: 123/75   Pulse: 78  Temp: 98.5 F (36.9 C)  TempSrc: Oral  SpO2: 96%  Weight: 187 lb (84.8 kg)       Assessment & Plan:    Jacary Reffner is a 53 y.o. male Right hip pain  -Possible trochanteric bursitis but also some symptoms posteriorly.  Trial of conservative treatment initially, Tylenol as needed, stretches, ice, follow-up with  orthopedist if persistent or can follow-up with me next few weeks for possible injection.  Urinary frequency - Plan: doxazosin (CARDURA) 1 MG tablet Urinary urgency - Plan: doxazosin (CARDURA) 1 MG tablet Nocturia - Plan: doxazosin (CARDURA) 1 MG tablet  -Possible BPH, moderate symptom score as above.  Trial of low-dose alpha with Cardura initially.  Urology follow-up planned.  Abdominal pain, generalized  -Now improved/resolved.  If any recurrence of pain would recommend gastroenterology follow-up, but if acute worsening of pain or recurrent dark stools, eval right away here or other care facility.  Meds ordered this encounter  Medications  . doxazosin (CARDURA) 1 MG tablet    Sig: Take 1 tablet (1 mg total) by mouth daily.    Dispense:  30 tablet    Refill:  2   Patient Instructions    If any return of abdominal pain - I do want you to meet with gastroenterology. Let me know and I will place a referral. If any return of dark stools or worsening pain - be seen here or other medical provider right away.   Keep follow up with urology, but can try doxazosin once per day for possible prostate issues.   Hip pain could be trochanteric bursitis but also some soreness behind that area.  Would recommend follow-up with Dr. Rip Harbour or Dr. Berenice Primas to evaluate for possible injection or physical therapy of that area if not improving next few weeks.  Okay to use Tylenol, gentle range of motion, stretches for now.  See more information below.   Return to the clinic or go to the nearest emergency room if any of your symptoms worsen or new symptoms occur.    Hip  Bursitis  Hip bursitis is inflammation of a fluid-filled sac (bursa) in the hip joint. The bursa prevents the bones in the hip joint from rubbing against each other. Hip bursitis can cause mild to moderate pain, and symptoms often come and go over time. What are the causes? This condition may be caused by:  Injury to the hip.  Overuse of the muscles that surround the hip joint.  Previous injury or surgery of the hip.  Arthritis or gout.  Diabetes.  Thyroid disease.  Infection. In some cases, the cause may not be known. What are the signs or symptoms? Symptoms of this condition include:  Mild or moderate pain in the hip area. Pain may get worse with movement.  Tenderness and swelling of the hip, especially on the outer side of the hip.  In rare cases, the bursa may become infected. This may cause a fever, as well as warmth and redness in the area. Symptoms may come and go. How is this diagnosed? This condition may be diagnosed based on:  A physical exam.  Your medical history.  X-rays.  Removal of fluid from your inflamed bursa for testing (biopsy). You may be sent to a health care provider who specializes in bone diseases (orthopedist) or a provider who specializes in joint inflammation (rheumatologist). How is this treated? This condition is treated by resting, icing, applying pressure (compression), and raising (elevating) the injured area. This is called RICE treatment. In some cases, this may be enough to make your symptoms go away. Treatment may also include:  Using crutches.  Draining fluid out of the bursa to help relieve swelling.  Injecting medicine that helps to reduce inflammation (cortisone).  Additional medicines if the bursa is infected. Follow these instructions at home: Managing pain, stiffness, and swelling   If  directed, put ice on the painful area. ? Put ice in a plastic bag. ? Place a towel between your skin and the bag. ? Leave the ice on  for 20 minutes, 2-3 times a day. ? Raise (elevate) your hip above the level of your heart as much as you can without pain. To do this, try putting a pillow under your hips while you lie down. Activity  Return to your normal activities as told by your health care provider. Ask your health care provider what activities are safe for you.  Rest and protect your hip as much as possible until your pain and swelling get better. General instructions  Take over-the-counter and prescription medicines only as told by your health care provider.  Wear compression wraps only as told by your health care provider.  Do not use your hip to support your body weight until your health care provider says that you can. Use crutches as told by your health care provider.  Gently massage and stretch your injured area as often as is comfortable.  Keep all follow-up visits as told by your health care provider. This is important. How is this prevented?  Exercise regularly, as told by your health care provider.  Warm up and stretch before being active.  Cool down and stretch after being active.  If an activity irritates your hip or causes pain, avoid the activity as much as possible.  Avoid sitting down for long periods at a time. Contact a health care provider if you:  Have a fever.  Develop new symptoms.  Have difficulty walking or doing everyday activities.  Have pain that gets worse or does not get better with medicine.  Develop red skin or a feeling of warmth in your hip area. Get help right away if you:  Cannot move your hip.  Have severe pain. Summary  Hip bursitis is inflammation of a fluid-filled sac (bursa) in the hip joint.  Hip bursitis can cause mild to moderate pain, and symptoms often come and go over time.  This condition is treated with rest, ice, compression, elevation, and medicines. This information is not intended to replace advice given to you by your health care provider.  Make sure you discuss any questions you have with your health care provider. Document Released: 09/01/2001 Document Revised: 11/18/2017 Document Reviewed: 11/18/2017 Elsevier Patient Education  [employer].    If you have lab work done today you will be contacted with your lab results within the next 2 weeks.  If you have not heard from [place] then please contact us. The fastest way to get your results is to register for My Chart.   IF you received an x-ray today, you will receive an invoice from [facility]. Please contact [facility] at [phone] with questions or concerns regarding your invoice.   IF you received labwork today, you will receive an invoice from [place]. Please contact [facility] at [phone] with questions or concerns regarding your invoice.   Our billing staff will not be able to assist you with questions regarding bills from these companies.  You will be contacted with the lab results as soon as they are available. The fastest way to get your results is to activate your My Chart account. Instructions are located on the last page of this paperwork. If you have not heard from [place] regarding the results in 2 weeks, please contact this office.       Signed,   [name], MD [facility].  01/12/19 10:24 AM

## 2019-01-12 NOTE — Patient Instructions (Addendum)
If any return of abdominal pain - I do want you to meet with gastroenterology. Let me know and I will place a referral. If any return of dark stools or worsening pain - be seen here or other medical provider right away.   Keep follow up with urology, but can try doxazosin once per day for possible prostate issues.   Hip pain could be trochanteric bursitis but also some soreness behind that area.  Would recommend follow-up with Dr. Rip Harbour or Dr. Berenice Primas to evaluate for possible injection or physical therapy of that area if not improving next few weeks.  Okay to use Tylenol, gentle range of motion, stretches for now.  See more information below.   Return to the clinic or go to the nearest emergency room if any of your symptoms worsen or new symptoms occur.    Hip Bursitis  Hip bursitis is inflammation of a fluid-filled sac (bursa) in the hip joint. The bursa prevents the bones in the hip joint from rubbing against each other. Hip bursitis can cause mild to moderate pain, and symptoms often come and go over time. What are the causes? This condition may be caused by:  Injury to the hip.  Overuse of the muscles that surround the hip joint.  Previous injury or surgery of the hip.  Arthritis or gout.  Diabetes.  Thyroid disease.  Infection. In some cases, the cause may not be known. What are the signs or symptoms? Symptoms of this condition include:  Mild or moderate pain in the hip area. Pain may get worse with movement.  Tenderness and swelling of the hip, especially on the outer side of the hip.  In rare cases, the bursa may become infected. This may cause a fever, as well as warmth and redness in the area. Symptoms may come and go. How is this diagnosed? This condition may be diagnosed based on:  A physical exam.  Your medical history.  X-rays.  Removal of fluid from your inflamed bursa for testing (biopsy). You may be sent to a health care provider who specializes in  bone diseases (orthopedist) or a provider who specializes in joint inflammation (rheumatologist). How is this treated? This condition is treated by resting, icing, applying pressure (compression), and raising (elevating) the injured area. This is called RICE treatment. In some cases, this may be enough to make your symptoms go away. Treatment may also include:  Using crutches.  Draining fluid out of the bursa to help relieve swelling.  Injecting medicine that helps to reduce inflammation (cortisone).  Additional medicines if the bursa is infected. Follow these instructions at home: Managing pain, stiffness, and swelling   If directed, put ice on the painful area. ? Put ice in a plastic bag. ? Place a towel between your skin and the bag. ? Leave the ice on for 20 minutes, 2-3 times a day. ? Raise (elevate) your hip above the level of your heart as much as you can without pain. To do this, try putting a pillow under your hips while you lie down. Activity  Return to your normal activities as told by your health care provider. Ask your health care provider what activities are safe for you.  Rest and protect your hip as much as possible until your pain and swelling get better. General instructions  Take over-the-counter and prescription medicines only as told by your health care provider.  Wear compression wraps only as told by your health care provider.  Do not use your  hip to support your body weight until your health care provider says that you can. Use crutches as told by your health care provider.  Gently massage and stretch your injured area as often as is comfortable.  Keep all follow-up visits as told by your health care provider. This is important. How is this prevented?  Exercise regularly, as told by your health care provider.  Warm up and stretch before being active.  Cool down and stretch after being active.  If an activity irritates your hip or causes pain, avoid  the activity as much as possible.  Avoid sitting down for long periods at a time. Contact a health care provider if you:  Have a fever.  Develop new symptoms.  Have difficulty walking or doing everyday activities.  Have pain that gets worse or does not get better with medicine.  Develop red skin or a feeling of warmth in your hip area. Get help right away if you:  Cannot move your hip.  Have severe pain. Summary  Hip bursitis is inflammation of a fluid-filled sac (bursa) in the hip joint.  Hip bursitis can cause mild to moderate pain, and symptoms often come and go over time.  This condition is treated with rest, ice, compression, elevation, and medicines. This information is not intended to replace advice given to you by your health care provider. Make sure you discuss any questions you have with your health care provider. Document Released: 09/01/2001 Document Revised: 11/18/2017 Document Reviewed: 11/18/2017 Elsevier Patient Education  [employer].    If you have lab work done today you will be contacted with your lab results within the next 2 weeks.  If you have not heard from [place] then please contact us. The fastest way to get your results is to register for My Chart.   IF you received an x-ray today, you will receive an invoice from [facility]. Please contact [facility] at [phone] with questions or concerns regarding your invoice.   IF you received labwork today, you will receive an invoice from [place]. Please contact [facility] at [phone] with questions or concerns regarding your invoice.   Our billing staff will not be able to assist you with questions regarding bills from these companies.  You will be contacted with the lab results as soon as they are available. The fastest way to get your results is to activate your My Chart account. Instructions are located on the last page of this paperwork. If you have not heard from [place]  regarding the results in 2 weeks, please contact this office.

## 2019-02-05 ENCOUNTER — Other Ambulatory Visit: Payer: Self-pay | Admitting: Orthopedic Surgery

## 2019-02-06 ENCOUNTER — Other Ambulatory Visit: Payer: Self-pay

## 2019-02-06 ENCOUNTER — Ambulatory Visit
Admission: RE | Admit: 2019-02-06 | Discharge: 2019-02-06 | Disposition: A | Discharge status: Home / Self Care | Payer: 59 | Source: Ambulatory Visit | Attending: Orthopedic Surgery | Admitting: Orthopedic Surgery

## 2019-02-06 ENCOUNTER — Other Ambulatory Visit: Payer: Self-pay | Admitting: Orthopedic Surgery

## 2019-02-06 ENCOUNTER — Encounter (HOSPITAL_BASED_OUTPATIENT_CLINIC_OR_DEPARTMENT_OTHER): Payer: Self-pay | Admitting: *Deleted

## 2019-02-06 DIAGNOSIS — S52571A Other intraarticular fracture of lower end of right radius, initial encounter for closed fracture: Secondary | ICD-10-CM

## 2019-02-07 ENCOUNTER — Other Ambulatory Visit (HOSPITAL_COMMUNITY)
Admission: RE | Admit: 2019-02-07 | Discharge: 2019-02-07 | Disposition: A | Discharge status: Home / Self Care | Payer: 59 | Source: Ambulatory Visit | Attending: Orthopedic Surgery | Admitting: Orthopedic Surgery

## 2019-02-07 DIAGNOSIS — Z01812 Encounter for preprocedural laboratory examination: Secondary | ICD-10-CM | POA: Insufficient documentation

## 2019-02-07 DIAGNOSIS — Z20828 Contact with and (suspected) exposure to other viral communicable diseases: Secondary | ICD-10-CM | POA: Insufficient documentation

## 2019-02-08 LAB — NOVEL CORONAVIRUS, NAA (HOSP ORDER, SEND-OUT TO REF LAB; TAT 18-24 HRS): SARS-CoV-2, NAA: NOT DETECTED

## 2019-02-10 NOTE — H&P (Signed)
Kyle Swanson is an 53 y.o. male.   CC / Reason for Visit: Right wrist injury HPI: This patient is a 53 year old RHD male who presents for evaluation of her right wrist injury that occurred when he fell onto an outstretched hand yesterday.  He is employed at what used to be [employer], in [employer], educational.  He has been using OTC medicines for pain relief, and has noted swelling, with increased pain with wrist movement  Past Medical History:  Diagnosis Date  . Allergic rhinitis 04/28/2012  . Allergy   . Anxiety   . Asthma    as a child - no meds or inhalers now  . Closed fracture of right distal radius     Past Surgical History:  Procedure Laterality Date  . GANGLION CYST EXCISION     left wrist 10 years ago  . TONSILLECTOMY AND ADENOIDECTOMY     childhood    Family History  Problem Relation Age of Onset  . COPD Father        emphysema  . Heart disease Father   . Cancer Maternal Grandmother        skin  . Cancer Paternal Grandmother   . Diabetes Paternal Grandfather   . Colon cancer Neg Hx   . Esophageal cancer Neg Hx   . Rectal cancer Neg Hx   . Stomach cancer Neg Hx    Social History:  reports that he has never smoked. He has never used smokeless tobacco. He reports current alcohol use of about 4.0 - 5.0 standard drinks of alcohol per week. He reports that he does not use drugs.  Allergies:  Allergies  Allergen Reactions  . Sulfa Antibiotics Rash    No medications prior to admission.    No results found for this or any previous visit (from the past 48 hour(s)). No results found.  Review of Systems  All other systems reviewed and are negative.   Height 6\' 2"  (1.88 m), weight 85.3 kg. Physical Exam  Constitutional:  WD, WN, NAD HEENT:  NCAT, EOMI Neuro/Psych:  Alert & oriented to person, place, and time; appropriate mood & affect Lymphatic: No generalized UE edema or lymphadenopathy Extremities / MSK:  Both UE are normal with respect to appearance, ranges of  motion, joint stability, muscle strength/tone, sensation, & perfusion except as otherwise noted:  The right wrist is swollen and tender about the distal radius.  Intact light touch sensibility in the radial, median, and ulnar nerve distributions with intact motor to the same.  Intact pain with wrist movement passively as well as forearm rotation  Labs / Xrays:  3 views of the right wrist ordered and obtained today reveals a comminuted intra-articular displaced distal radius fracture.  The radial column may be intact, with shortening and flattening an intra-articular displacement of the ulnar portion of the distal radius, with what appears to be some degree of intra-articular incongruity at both the radiocarpal joint and DRUJ  Assessment: Displaced right DRFx  Plan:  I discussed these findings with him.  I used models and reviewed the radiographs with him.  I recommend operative treatment to obtain and maintain a more anatomic alignment to reduce the risks of chronic pain, posttraumatic arthritis, and loss of forearm rotation or impingement pain with it.  He is placed into a sugar tong splint today, and referred for CT scan of the distal radius to better appreciate the number and position/alignment of the fragments to assist in surgical planning.  We will plan to  proceed with surgical skeletal reconstruction on Wednesday.  I will call him regarding the scan after I have reviewed it, at [phone].  Implications related to resumption's of activity and impact on work were also reviewed.  The details of the operative procedure were discussed with the patient.  Questions were invited and answered.  In addition to the goal of the procedure, the risks of the procedure to include but not limited to bleeding; infection; damage to the nerves or blood vessels that could result in bleeding, numbness, weakness, chronic pain, and the need for additional procedures; stiffness; the need for revision surgery; and  anesthetic risks were reviewed.  No specific outcome was guaranteed or implied.  Informed consent was obtained.  Jolyn Nap, MD 02/10/2019, 9:08 AM

## 2019-02-11 ENCOUNTER — Other Ambulatory Visit: Payer: Self-pay

## 2019-02-11 ENCOUNTER — Ambulatory Visit (HOSPITAL_BASED_OUTPATIENT_CLINIC_OR_DEPARTMENT_OTHER)
Admission: RE | Admit: 2019-02-11 | Discharge: 2019-02-11 | Disposition: A | Discharge status: Home / Self Care | Payer: 59 | Attending: Orthopedic Surgery | Admitting: Orthopedic Surgery

## 2019-02-11 ENCOUNTER — Ambulatory Visit (HOSPITAL_BASED_OUTPATIENT_CLINIC_OR_DEPARTMENT_OTHER): Payer: 59 | Admitting: Anesthesiology

## 2019-02-11 ENCOUNTER — Encounter (HOSPITAL_BASED_OUTPATIENT_CLINIC_OR_DEPARTMENT_OTHER)
Admission: RE | Disposition: A | Discharge status: Home / Self Care | Payer: Self-pay | Source: Home / Self Care | Attending: Orthopedic Surgery

## 2019-02-11 ENCOUNTER — Ambulatory Visit (HOSPITAL_COMMUNITY): Payer: 59

## 2019-02-11 ENCOUNTER — Encounter (HOSPITAL_BASED_OUTPATIENT_CLINIC_OR_DEPARTMENT_OTHER): Payer: Self-pay | Admitting: Anesthesiology

## 2019-02-11 DIAGNOSIS — W1830XA Fall on same level, unspecified, initial encounter: Secondary | ICD-10-CM | POA: Insufficient documentation

## 2019-02-11 DIAGNOSIS — S52501A Unspecified fracture of the lower end of right radius, initial encounter for closed fracture: Secondary | ICD-10-CM

## 2019-02-11 DIAGNOSIS — S52571A Other intraarticular fracture of lower end of right radius, initial encounter for closed fracture: Secondary | ICD-10-CM | POA: Insufficient documentation

## 2019-02-11 HISTORY — PX: OPEN REDUCTION INTERNAL FIXATION (ORIF) DISTAL RADIAL FRACTURE: SHX5989

## 2019-02-11 HISTORY — DX: Unspecified fracture of the lower end of right radius, initial encounter for closed fracture: S52.501A

## 2019-02-11 SURGERY — OPEN REDUCTION INTERNAL FIXATION (ORIF) DISTAL RADIUS FRACTURE
Anesthesia: Regional | Site: Wrist | Laterality: Right

## 2019-02-11 MED ORDER — OXYCODONE HCL 5 MG/5ML PO SOLN: 5.0000 mg | Freq: Once | ORAL | Status: DC | PRN

## 2019-02-11 MED ORDER — CEFAZOLIN SODIUM-DEXTROSE 2-4 GM/100ML-% IV SOLN
2.0000 g | INTRAVENOUS | Status: AC
  Administered 2019-02-11: 2 g via INTRAVENOUS

## 2019-02-11 MED ORDER — CHLORHEXIDINE GLUCONATE 4 % EX LIQD: 60.0000 mL | Freq: Once | CUTANEOUS | Status: DC

## 2019-02-11 MED ORDER — DEXAMETHASONE SODIUM PHOSPHATE 10 MG/ML IJ SOLN
INTRAMUSCULAR | Status: AC
  Filled 2019-02-11: qty 1

## 2019-02-11 MED ORDER — PROPOFOL 500 MG/50ML IV EMUL
INTRAVENOUS | Status: DC | PRN
  Administered 2019-02-11: 25 ug/kg/min via INTRAVENOUS

## 2019-02-11 MED ORDER — FENTANYL CITRATE (PF) 100 MCG/2ML IJ SOLN
INTRAMUSCULAR | Status: AC
  Filled 2019-02-11: qty 2

## 2019-02-11 MED ORDER — MIDAZOLAM HCL 2 MG/2ML IJ SOLN
INTRAMUSCULAR | Status: AC
  Filled 2019-02-11: qty 2

## 2019-02-11 MED ORDER — CEFAZOLIN SODIUM-DEXTROSE 2-4 GM/100ML-% IV SOLN
INTRAVENOUS | Status: AC
  Filled 2019-02-11: qty 100

## 2019-02-11 MED ORDER — FENTANYL CITRATE (PF) 100 MCG/2ML IJ SOLN
INTRAMUSCULAR | Status: DC | PRN
  Administered 2019-02-11 (×2): 50 ug via INTRAVENOUS

## 2019-02-11 MED ORDER — ACETAMINOPHEN 325 MG PO TABS: 650.0000 mg | ORAL_TABLET | Freq: Four times a day (QID) | ORAL | Status: DC

## 2019-02-11 MED ORDER — MIDAZOLAM HCL 2 MG/2ML IJ SOLN
1.0000 mg | INTRAMUSCULAR | Status: DC | PRN
  Administered 2019-02-11: 2 mg via INTRAVENOUS

## 2019-02-11 MED ORDER — LACTATED RINGERS IV SOLN
INTRAVENOUS | Status: DC
  Administered 2019-02-11 (×2): via INTRAVENOUS

## 2019-02-11 MED ORDER — CLONIDINE HCL (ANALGESIA) 100 MCG/ML EP SOLN
EPIDURAL | Status: DC | PRN
  Administered 2019-02-11: 100 ug

## 2019-02-11 MED ORDER — OXYCODONE HCL 5 MG PO TABS: 5.0000 mg | ORAL_TABLET | Freq: Once | ORAL | Status: DC | PRN

## 2019-02-11 MED ORDER — PROPOFOL 10 MG/ML IV BOLUS
INTRAVENOUS | Status: AC
  Filled 2019-02-11: qty 20

## 2019-02-11 MED ORDER — MIDAZOLAM HCL 5 MG/5ML IJ SOLN
INTRAMUSCULAR | Status: DC | PRN
  Administered 2019-02-11: 2 mg via INTRAVENOUS

## 2019-02-11 MED ORDER — HYDROMORPHONE HCL 1 MG/ML IJ SOLN: 0.2500 mg | INTRAMUSCULAR | Status: DC | PRN

## 2019-02-11 MED ORDER — PROMETHAZINE HCL 25 MG/ML IJ SOLN: 6.2500 mg | INTRAMUSCULAR | Status: DC | PRN

## 2019-02-11 MED ORDER — BUPIVACAINE HCL (PF) 0.5 % IJ SOLN
INTRAMUSCULAR | Status: AC
  Filled 2019-02-11: qty 30

## 2019-02-11 MED ORDER — BUPIVACAINE HCL (PF) 0.5 % IJ SOLN
INTRAMUSCULAR | Status: DC | PRN
  Administered 2019-02-11: 40 mL via PERINEURAL

## 2019-02-11 MED ORDER — ONDANSETRON HCL 4 MG/2ML IJ SOLN
INTRAMUSCULAR | Status: DC | PRN
  Administered 2019-02-11: 4 mg via INTRAVENOUS

## 2019-02-11 MED ORDER — ONDANSETRON HCL 4 MG/2ML IJ SOLN
INTRAMUSCULAR | Status: AC
  Filled 2019-02-11: qty 2

## 2019-02-11 MED ORDER — FENTANYL CITRATE (PF) 100 MCG/2ML IJ SOLN
50.0000 ug | INTRAMUSCULAR | Status: DC | PRN
  Administered 2019-02-11: 100 ug via INTRAVENOUS

## 2019-02-11 MED ORDER — LIDOCAINE 2% (20 MG/ML) 5 ML SYRINGE
INTRAMUSCULAR | Status: AC
  Filled 2019-02-11: qty 5

## 2019-02-11 MED ORDER — DEXAMETHASONE SODIUM PHOSPHATE 4 MG/ML IJ SOLN
INTRAMUSCULAR | Status: DC | PRN
  Administered 2019-02-11: 10 mg via PERINEURAL

## 2019-02-11 SURGICAL SUPPLY — 75 items
APL PRP STRL LF DISP 70% ISPRP (MISCELLANEOUS) ×1
BAND INSRT 18 STRL LF DISP RB (MISCELLANEOUS)
BAND RUBBER #18 3X1/16 STRL (MISCELLANEOUS) IMPLANT
BIT DRILL SOLID 2.0X40MM (BIT) IMPLANT
BIT DRILL SOLID 2.5X40MM (BIT) IMPLANT
BLADE MINI RND TIP GREEN BEAV (BLADE) IMPLANT
BLADE SURG 15 STRL LF DISP TIS (BLADE) ×1 IMPLANT
BLADE SURG 15 STRL SS (BLADE) ×4
BNDG CMPR 9X4 STRL LF SNTH (GAUZE/BANDAGES/DRESSINGS) ×1
BNDG COHESIVE 2X5 TAN STRL LF (GAUZE/BANDAGES/DRESSINGS) IMPLANT
BNDG COHESIVE 4X5 TAN STRL (GAUZE/BANDAGES/DRESSINGS) ×2 IMPLANT
BNDG ESMARK 4X9 LF (GAUZE/BANDAGES/DRESSINGS) ×2 IMPLANT
BNDG GAUZE ELAST 4 BULKY (GAUZE/BANDAGES/DRESSINGS) ×2 IMPLANT
BRUSH SCRUB EZ PLAIN DRY (MISCELLANEOUS) IMPLANT
CANISTER SUCT 1200ML W/VALVE (MISCELLANEOUS) ×2 IMPLANT
CHLORAPREP W/TINT 26 (MISCELLANEOUS) ×2 IMPLANT
CORD BIPOLAR FORCEPS 12FT (ELECTRODE) ×2 IMPLANT
COVER BACK TABLE REUSABLE LG (DRAPES) ×2 IMPLANT
COVER MAYO STAND REUSABLE (DRAPES) ×3 IMPLANT
COVER WAND RF STERILE (DRAPES) IMPLANT
CUFF TOURN SGL QUICK 18X4 (TOURNIQUET CUFF) ×1 IMPLANT
CUFF TOURN SGL QUICK 24 (TOURNIQUET CUFF)
CUFF TRNQT CYL 24X4X16.5-23 (TOURNIQUET CUFF) IMPLANT
DRAPE C-ARM 42X72 X-RAY (DRAPES) ×2 IMPLANT
DRAPE EXTREMITY T 121X128X90 (DISPOSABLE) ×2 IMPLANT
DRAPE SURG 17X23 STRL (DRAPES) ×2 IMPLANT
DRILL SOLID 2.0X40MM (BIT)
DRILL SOLID 2.5X40MM (BIT)
DRSG ADAPTIC 3X8 NADH LF (GAUZE/BANDAGES/DRESSINGS) ×2 IMPLANT
DRSG EMULSION OIL 3X3 NADH (GAUZE/BANDAGES/DRESSINGS) IMPLANT
Driver, AO connection, Square Tip 2.0 mm ×2 IMPLANT
ELECT REM PT RETURN 9FT ADLT (ELECTROSURGICAL) ×2
ELECTRODE REM PT RTRN 9FT ADLT (ELECTROSURGICAL) ×1 IMPLANT
GAUZE SPONGE 4X4 12PLY STRL LF (GAUZE/BANDAGES/DRESSINGS) ×2 IMPLANT
GLOVE BIO SURGEON STRL SZ 6.5 (GLOVE) ×1 IMPLANT
GLOVE BIO SURGEON STRL SZ7.5 (GLOVE) ×2 IMPLANT
GLOVE BIOGEL M STRL SZ7.5 (GLOVE) ×1 IMPLANT
GLOVE BIOGEL PI IND STRL 7.0 (GLOVE) ×1 IMPLANT
GLOVE BIOGEL PI IND STRL 7.5 (GLOVE) IMPLANT
GLOVE BIOGEL PI IND STRL 8 (GLOVE) ×1 IMPLANT
GLOVE BIOGEL PI INDICATOR 7.0 (GLOVE) ×1
GLOVE BIOGEL PI INDICATOR 7.5 (GLOVE) ×2
GLOVE BIOGEL PI INDICATOR 8 (GLOVE) ×2
GLOVE ECLIPSE 6.5 STRL STRAW (GLOVE) ×2 IMPLANT
GOWN STRL REUS W/ TWL LRG LVL3 (GOWN DISPOSABLE) ×2 IMPLANT
GOWN STRL REUS W/TWL LRG LVL3 (GOWN DISPOSABLE) ×2
GOWN STRL REUS W/TWL XL LVL3 (GOWN DISPOSABLE) ×4 IMPLANT
GUIDE AIMING 1.5MM (WIRE) ×2 IMPLANT
NDL HYPO 25X1 1.5 SAFETY (NEEDLE) IMPLANT
NEEDLE HYPO 25X1 1.5 SAFETY (NEEDLE) IMPLANT
NS IRRIG 1000ML POUR BTL (IV SOLUTION) ×2 IMPLANT
PACK BASIN DAY SURGERY FS (CUSTOM PROCEDURE TRAY) ×2 IMPLANT
PADDING CAST ABS 4INX4YD NS (CAST SUPPLIES) ×1
PADDING CAST ABS COTTON 4X4 ST (CAST SUPPLIES) IMPLANT
PENCIL SMOKE EVACUATOR (MISCELLANEOUS) ×2 IMPLANT
SKELETAL DYNAMICS DVR SET (Set) ×1 IMPLANT
SLEEVE SCD COMPRESS KNEE MED (MISCELLANEOUS) ×2 IMPLANT
SLING ARM FOAM STRAP LRG (SOFTGOODS) ×1 IMPLANT
SPLINT PLASTER CAST XFAST 3X15 (CAST SUPPLIES) IMPLANT
SPLINT PLASTER XTRA FASTSET 3X (CAST SUPPLIES)
STOCKINETTE 6  STRL (DRAPES) ×1
STOCKINETTE 6 STRL (DRAPES) ×1 IMPLANT
SUCTION FRAZIER HANDLE 10FR (MISCELLANEOUS) ×1
SUCTION TUBE FRAZIER 10FR DISP (MISCELLANEOUS) ×1 IMPLANT
SUT VIC AB 2-0 PS2 27 (SUTURE) ×2 IMPLANT
SUT VICRYL 4-0 PS2 18IN ABS (SUTURE) IMPLANT
SUT VICRYL RAPIDE 4-0 (SUTURE) IMPLANT
SUT VICRYL RAPIDE 4/0 PS 2 (SUTURE) ×2 IMPLANT
SYR 10ML LL (SYRINGE) IMPLANT
SYR BULB 3OZ (MISCELLANEOUS) ×2 IMPLANT
TOWEL GREEN STERILE FF (TOWEL DISPOSABLE) ×2 IMPLANT
TUBE CONNECTING 20X1/4 (TUBING) ×2 IMPLANT
UNDERPAD 30X36 HEAVY ABSORB (UNDERPADS AND DIAPERS) ×2 IMPLANT
WIRE FIX 1.5 STANDARD TIP (WIRE)
WIRE FIX 1.5 STD TIP (WIRE) IMPLANT

## 2019-02-11 NOTE — Interval H&P Note (Signed)
History and Physical Interval Note:  02/11/2019 12:37 PM  Kyle Swanson  has presented today for surgery, with the diagnosis of RIGHT DISTAL RADIUS FRACTURE.  The various methods of treatment have been discussed with the patient and family. After consideration of risks, benefits and other options for treatment, the patient has consented to  Procedure(s) with comments: OPEN REDUCTION INTERNAL FIXATION (ORIF) DISTAL RADIAL FRACTURE (Right) - SURGERY REQUEST TIME: 90 MINUTES PRE OP REGIONAL as a surgical intervention.  The patient's history has been reviewed, patient examined, no change in status, stable for surgery.  I have reviewed the patient's chart and labs.  Questions were answered to the patient's satisfaction.     Jolyn Nap

## 2019-02-11 NOTE — Transfer of Care (Signed)
Immediate Anesthesia Transfer of Care Note  Patient: Han Vejar  Procedure(s) Performed: OPEN REDUCTION INTERNAL FIXATION (ORIF) DISTAL RADIAL FRACTURE (Right Wrist)  Patient Location: PACU  Anesthesia Type:MAC combined with regional for post-op pain  Level of Consciousness: awake and patient cooperative  Airway & Oxygen Therapy: Patient Spontanous Breathing and Patient connected to face mask oxygen  Post-op Assessment: Report given to RN and Post -op Vital signs reviewed and stable  Post vital signs: Reviewed and stable  Last Vitals:  Vitals Value Taken Time  BP    Temp    Pulse 88 02/11/19 1419  Resp 19 02/11/19 1419  SpO2 97 % 02/11/19 1419  Vitals shown include unvalidated device data.  Last Pain:  Vitals:   02/11/19 1053  TempSrc: Oral  PainSc: 6       Patients Stated Pain Goal: 4 (47/65/46 [id number])  Complications: No apparent anesthesia complications

## 2019-02-11 NOTE — Discharge Instructions (Addendum)
Discharge Instructions   You have a dressing with a plaster splint incorporated in it. Move your fingers as much as possible, making a full fist and fully opening the fist. Elevate your hand to reduce pain & swelling of the digits.  Ice over the operative site may be helpful to reduce pain & swelling.  DO NOT USE HEAT. Pain medicine has been prescribed for you.  Continue your meloxicam daily. Add Tylenol 650 mg every 6 hours and the prescribed pain medicine additionally if you have severe post op pain. It is meant as a rescue medicine. Leave the dressing in place until you return to our office.  You may shower, but keep the bandage clean & dry.  You may drive a car when you are off of prescription pain medications and can safely control your vehicle with both hands. Our office will call you to arrange follow-up   Please call [phone] during normal business hours or [phone] after hours for any problems. Including the following:  - excessive redness of the incisions - drainage for more than 4 days - fever of more than 101.5 F  *Please note that pain medications will not be refilled after hours or on weekends.  WORK STATUS: Out of work until first post operative appointment in 10-15 days.  Patient plans to use short term disability and has sent paperwork to [employer].  Work status will be addressed and modified at each office visit throughout the course of healing.     Post Anesthesia Home Care Instructions  Activity: Get plenty of rest for the remainder of the day. A responsible individual must stay with you for 24 hours following the procedure.  For the next 24 hours, DO NOT: -Drive a car -Paediatric nurse -Drink alcoholic beverages -Take any medication unless instructed by your physician -Make any legal decisions or sign important papers.  Meals: Start with liquid foods such as gelatin or soup. Progress to regular foods as tolerated. Avoid greasy, spicy,  heavy foods. If nausea and/or vomiting occur, drink only clear liquids until the nausea and/or vomiting subsides. Call your physician if vomiting continues.  Special Instructions/Symptoms: Your throat may feel dry or sore from the anesthesia or the breathing tube placed in your throat during surgery. If this causes discomfort, gargle with warm salt water. The discomfort should disappear within 24 hours.  If you had a scopolamine patch placed behind your ear for the management of post- operative nausea and/or vomiting:  1. The medication in the patch is effective for 72 hours, after which it should be removed.  Wrap patch in a tissue and discard in the trash. Wash hands thoroughly with soap and water. 2. You may remove the patch earlier than 72 hours if you experience unpleasant side effects which may include dry mouth, dizziness or visual disturbances. 3. Avoid touching the patch. Wash your hands with soap and water after contact with the patch.       Regional Anesthesia Blocks  1. Numbness or the inability to move the "blocked" extremity may last from 3-48 hours after placement. The length of time depends on the medication injected and your individual response to the medication. If the numbness is not going away after 48 hours, call your surgeon.  2. The extremity that is blocked will need to be protected until the numbness is gone and the  Strength has returned. Because you cannot feel it, you will need to take extra care to avoid injury. Because it may be weak,  you may have difficulty moving it or using it. You may not know what position it is in without looking at it while the block is in effect.  3. For blocks in the legs and feet, returning to weight bearing and walking needs to be done carefully. You will need to wait until the numbness is entirely gone and the strength has returned. You should be able to move your leg and foot normally before you try and bear weight or walk. You will need  someone to be with you when you first try to ensure you do not fall and possibly risk injury.  4. Bruising and tenderness at the needle site are common side effects and will resolve in a few days.  5. Persistent numbness or new problems with movement should be communicated to the surgeon or the [facility] [phone] [place] [phone]).

## 2019-02-11 NOTE — Anesthesia Preprocedure Evaluation (Signed)
Anesthesia Evaluation  Patient identified by MRN, date of birth, ID band Patient awake    Reviewed: Allergy & Precautions, NPO status , Patient's Chart, lab work & pertinent test results  Airway Mallampati: II  TM Distance: >3 FB Neck ROM: Full    Dental  (+) Dental Advisory Given   Pulmonary asthma ,    Pulmonary exam normal breath sounds clear to auscultation       Cardiovascular negative cardio ROS Normal cardiovascular exam Rhythm:Regular Rate:Normal     Neuro/Psych PSYCHIATRIC DISORDERS Anxiety negative neurological ROS     GI/Hepatic negative GI ROS, Neg liver ROS,   Endo/Other  negative endocrine ROS  Renal/GU negative Renal ROS     Musculoskeletal negative musculoskeletal ROS (+)   Abdominal   Peds  Hematology negative hematology ROS (+)   Anesthesia Other Findings   Reproductive/Obstetrics                             Anesthesia Physical Anesthesia Plan  ASA: I  Anesthesia Plan: Regional   Post-op Pain Management:    Induction: Intravenous  PONV Risk Score and Plan: 1 and Propofol infusion, Ondansetron and Treatment may vary due to age or medical condition  Airway Management Planned: Simple Face Mask  Additional Equipment: None  Intra-op Plan:   Post-operative Plan:   Informed Consent: I have reviewed the patients History and Physical, chart, labs and discussed the procedure including the risks, benefits and alternatives for the proposed anesthesia with the patient or authorized representative who has indicated his/her understanding and acceptance.     Dental advisory given  Plan Discussed with: CRNA  Anesthesia Plan Comments:         Anesthesia Quick Evaluation

## 2019-02-11 NOTE — Op Note (Signed)
02/11/2019  12:38 PM  PATIENT:  Kyle Swanson  53 y.o. male  PRE-OPERATIVE DIAGNOSIS:  Displaced right intra-articular distal radius fracture  POST-OPERATIVE DIAGNOSIS:  Same  PROCEDURE:  ORIF Right displaced intra-articular distal radius fx (3+ frags), 91478  SURGEON: Rayvon Char. Grandville Silos, MD  PHYSICIAN ASSISTANT: Morley Kos, OPA-C  ANESTHESIA:  regional and MAC  SPECIMENS:  None  DRAINS: None  EBL:  less than 50 mL  PREOPERATIVE INDICATIONS:  [name] is a  54 y.o. male with a displaced intra-articular distal radius fracture  The risks benefits and alternatives were discussed with the patient preoperatively including but not limited to the risks of infection, bleeding, nerve injury, cardiopulmonary complications, the need for revision surgery, among others, and the patient verbalized understanding and consented to proceed.  OPERATIVE IMPLANTS: [employer] Geminus plate/screws/pegs  OPERATIVE PROCEDURE: After receiving prophylactic antibiotics & a regional block, the patient was escorted to the operative theatre and placed in a supine position.   A surgical "time-out" was performed during which the planned procedure, proposed operative site, and the correct patient identity were compared to the operative consent and agreement confirmed by the circulating nurse according to current facility policy. Following application of a tourniquet to the operative extremity, the exposed skin was already precleaned in the holding area, so it was simply prepped with Chloraprep and draped in the usual sterile fashion. The limb was exsanguinated with an Esmarch bandage and the tourniquet inflated to approximately 152mHg higher than systolic BP.   A sinusoidal-shaped incision was marked and made over the FCR axis and the distal forearm. The skin was incised sharply with scalpel, subcutaneous tissues with blunt and spreading dissection. The FCR axis was exploited deeply. The pronator  quadratus was reflected in an L-shaped ulnarly and the brachioradialis was split in a Z-plasty fashion for later reapproximation. The fracture was inspected and provisionally reduced, using the extended approach and rolling the radius out of the way.  In this manner, some combination was directly manipulated and one small articular piece removed.   It was placed in its provisional alignment of the radius and this was confirmed fluoroscopically.  It was secured to the radius with a screw through the slotted hole.  The plate was first affixed to the shaft, and used wires to secure first the ulnar portion, then the radial portion, once each was reduced.  This was confirmed fluoroscopically.  Additional adjustments were made as necessary, and the distal holes were all drilled and filled.  Peg/screw length distally was selected on the shorter side of measurements to minimize the risk for dorsal cortical penetration. The remainder of the proximal holes were drilled and filled.   Final images were obtained and the DRUJ was examined for stability. It was found to be sufficiently stable. The wound was then copiously irrigated and the brachioradialis repaired with 2-0 Vicryl Rapide suture followed by repair of the pronator quadratus with the same suture type. Tourniquet was released and additional hemostasis obtained and the skin was closed with 2-0 Vicryl deep dermal buried sutures followed by running 4-0 Vicryl Rapide horizontal mattress suture in the skin. A bulky dressing with a volar plaster component was applied and the patient was taken to the recovery room in stable condition.  DISPOSITION: The patient will be discharged home today with typical post-op instructions, returning in 10-15 days for reevaluation with new x-rays of the affected wrist out of the splint to include an inclined lateral and then transition to therapy to have a  custom splint constructed and begin rehabilitation.

## 2019-02-11 NOTE — Anesthesia Postprocedure Evaluation (Signed)
Anesthesia Post Note  Patient: Cordie Buening  Procedure(s) Performed: OPEN REDUCTION INTERNAL FIXATION (ORIF) DISTAL RADIAL FRACTURE (Right Wrist)     Patient location during evaluation: PACU Anesthesia Type: Regional and MAC Level of consciousness: awake and alert Pain management: pain level controlled Vital Signs Assessment: post-procedure vital signs reviewed and stable Respiratory status: spontaneous breathing, nonlabored ventilation and respiratory function stable Cardiovascular status: stable and blood pressure returned to baseline Postop Assessment: no apparent nausea or vomiting Anesthetic complications: no    Last Vitals:  Vitals:   02/11/19 1445 02/11/19 1515  BP: (!) 134/95 130/90  Pulse: 73 70  Resp: 12 16  Temp:  36.6 C  SpO2: 96% 96%    Last Pain:  Vitals:   02/11/19 1445  TempSrc:   PainSc: 0-No pain                 Lynda Rainwater

## 2019-02-11 NOTE — Progress Notes (Signed)
Assisted Dr. Germeroth with right, ultrasound guided, supraclavicular block. Side rails up, monitors on throughout procedure. See vital signs in flow sheet. Tolerated Procedure well. 

## 2019-02-11 NOTE — Anesthesia Procedure Notes (Signed)
Anesthesia Regional Block: Axillary brachial plexus block   Pre-Anesthetic Checklist: ,, timeout performed, Correct Patient, Correct Site, Correct Laterality, Correct Procedure, Correct Position, site marked, Risks and benefits discussed,  Surgical consent,  Pre-op evaluation,  At surgeon's request and post-op pain management  Laterality: Right  Prep: chloraprep       Needles:  Injection technique: Single-shot  Needle Type: Stimiplex          Additional Needles:   Procedures:,,,, ultrasound used (permanent image in chart),,,,  Narrative:  Start time: 02/11/2019 12:08 PM End time: 02/11/2019 12:13 PM  Performed by: Personally  Anesthesiologist: Nolon Nations, MD  Additional Notes: Patient tolerated the procedure well without complications

## 2019-02-11 NOTE — Anesthesia Procedure Notes (Signed)
Procedure Name: MAC Date/Time: 02/11/2019 12:43 PM Performed by: Marrianne Mood, CRNA Pre-anesthesia Checklist: Patient identified, Emergency Drugs available, Suction available, Patient being monitored and Timeout performed Oxygen Delivery Method: Simple face mask Preoxygenation: Pre-oxygenation with 100% oxygen

## 2019-02-12 ENCOUNTER — Encounter (HOSPITAL_BASED_OUTPATIENT_CLINIC_OR_DEPARTMENT_OTHER): Payer: Self-pay | Admitting: Orthopedic Surgery

## 2019-04-11 ENCOUNTER — Other Ambulatory Visit: Payer: Self-pay | Admitting: Family Medicine

## 2019-04-11 DIAGNOSIS — R351 Nocturia: Secondary | ICD-10-CM

## 2019-04-11 DIAGNOSIS — R3915 Urgency of urination: Secondary | ICD-10-CM

## 2019-04-11 DIAGNOSIS — R350 Frequency of micturition: Secondary | ICD-10-CM

## 2019-04-11 NOTE — Telephone Encounter (Signed)
Forwarding medication refill request to the clinical pool for review. 

## 2019-05-11 ENCOUNTER — Other Ambulatory Visit: Payer: Self-pay | Admitting: Family Medicine

## 2019-05-11 DIAGNOSIS — R3915 Urgency of urination: Secondary | ICD-10-CM

## 2019-05-11 DIAGNOSIS — R351 Nocturia: Secondary | ICD-10-CM

## 2019-05-11 DIAGNOSIS — R350 Frequency of micturition: Secondary | ICD-10-CM

## 2019-05-11 NOTE — Telephone Encounter (Signed)
Requested medication (s) are due for refill today: yes  Requested medication (s) are on the active medication list: yes  Last refill: 04/13/19  Future visit scheduled: no  Notes to clinic:  pt needs appt for refills per pharmacy note 04/13/19    Requested Prescriptions  Pending Prescriptions Disp Refills   doxazosin (CARDURA) 1 MG tablet [Pharmacy Med Name: DOXAZOSIN 1MG  TABLETS] 30 tablet 0    Sig: TAKE 1 TABLET(1 MG) BY MOUTH DAILY      Cardiovascular:  Alpha Blockers Failed - 05/11/2019  4:03 AM      Failed - Last BP in normal range    BP Readings from Last 1 Encounters:  02/11/19 130/90          Passed - Valid encounter within last 6 months    Recent Outpatient Visits           3 months ago Right hip pain   [facility] [place], [place]   4 months ago Abdominal pain, generalized   [facility], Ranell Patrick, MD   8 months ago Annual physical exam   [facility], Ranell Patrick, MD   1 year ago Annual physical exam   [facility], Ranell Patrick, MD   2 years ago General weakness   [facility], Ines Bloomer, MD

## 2020-11-10 ENCOUNTER — Encounter: Payer: Self-pay | Admitting: Family Medicine

## 2020-11-10 ENCOUNTER — Ambulatory Visit (INDEPENDENT_AMBULATORY_CARE_PROVIDER_SITE_OTHER): Payer: 59 | Admitting: Family Medicine

## 2020-11-10 ENCOUNTER — Other Ambulatory Visit: Payer: Self-pay

## 2020-11-10 VITALS — BP 122/74 | HR 95 | Temp 98.3°F | Resp 15 | Ht 74.0 in | Wt 214.0 lb

## 2020-11-10 DIAGNOSIS — R350 Frequency of micturition: Secondary | ICD-10-CM

## 2020-11-10 DIAGNOSIS — Z13220 Encounter for screening for lipoid disorders: Secondary | ICD-10-CM

## 2020-11-10 DIAGNOSIS — R3915 Urgency of urination: Secondary | ICD-10-CM

## 2020-11-10 DIAGNOSIS — Z0000 Encounter for general adult medical examination without abnormal findings: Secondary | ICD-10-CM | POA: Diagnosis not present

## 2020-11-10 DIAGNOSIS — Z131 Encounter for screening for diabetes mellitus: Secondary | ICD-10-CM | POA: Diagnosis not present

## 2020-11-10 DIAGNOSIS — Z130 Encounter for screening for diseases of the blood and blood-forming organs and certain disorders involving the immune mechanism: Secondary | ICD-10-CM

## 2020-11-10 DIAGNOSIS — R351 Nocturia: Secondary | ICD-10-CM

## 2020-11-10 DIAGNOSIS — Z1329 Encounter for screening for other suspected endocrine disorder: Secondary | ICD-10-CM

## 2020-11-10 LAB — LIPID PANEL
Cholesterol: 201 mg/dL — ABNORMAL HIGH (ref 0–200)
HDL: 54.3 mg/dL (ref >39.00)
LDL Cholesterol: 120 mg/dL — ABNORMAL HIGH (ref 0–99)
NonHDL: 147.04
Total CHOL/HDL Ratio: 4
Triglycerides: 135 mg/dL (ref 0.0–149.0)
VLDL: 27 mg/dL (ref 0.0–40.0)

## 2020-11-10 LAB — COMPREHENSIVE METABOLIC PANEL
ALT: 158 U/L — ABNORMAL HIGH (ref 0–53)
AST: 67 U/L — ABNORMAL HIGH (ref 0–37)
Albumin: 4.4 g/dL (ref 3.5–5.2)
Alkaline Phosphatase: 71 U/L (ref 39–117)
BUN: 17 mg/dL (ref 6–23)
CO2: 24 mEq/L (ref 19–32)
Calcium: 9.2 mg/dL (ref 8.4–10.5)
Chloride: 106 mEq/L (ref 96–112)
Creatinine, Ser: 1.03 mg/dL (ref 0.40–1.50)
GFR: 82.83 mL/min (ref >60.00)
Glucose, Bld: 87 mg/dL (ref 70–99)
Potassium: 4 mEq/L (ref 3.5–5.1)
Sodium: 140 mEq/L (ref 135–145)
Total Bilirubin: 0.6 mg/dL (ref 0.2–1.2)
Total Protein: 6.9 g/dL (ref 6.0–8.3)

## 2020-11-10 LAB — CBC WITH DIFFERENTIAL/PLATELET
Basophils Absolute: 0 10*3/uL (ref 0.0–0.1)
Basophils Relative: 0.6 % (ref 0.0–3.0)
Eosinophils Absolute: 0.3 10*3/uL (ref 0.0–0.7)
Eosinophils Relative: 5.4 % — ABNORMAL HIGH (ref 0.0–5.0)
HCT: 44.9 % (ref 39.0–52.0)
Hemoglobin: 15.4 g/dL (ref 13.0–17.0)
Lymphocytes Relative: 26.5 % (ref 12.0–46.0)
Lymphs Abs: 1.4 10*3/uL (ref 0.7–4.0)
MCHC: 34.3 g/dL (ref 30.0–36.0)
MCV: 88.3 fl (ref 78.0–100.0)
Monocytes Absolute: 0.4 10*3/uL (ref 0.1–1.0)
Monocytes Relative: 8.1 % (ref 3.0–12.0)
Neutro Abs: 3 10*3/uL (ref 1.4–7.7)
Neutrophils Relative %: 59.4 % (ref 43.0–77.0)
Platelets: 224 10*3/uL (ref 150.0–400.0)
RBC: 5.09 Mil/uL (ref 4.22–5.81)
RDW: 13.7 % (ref 11.5–15.5)
WBC: 5.1 10*3/uL (ref 4.0–10.5)

## 2020-11-10 LAB — TSH: TSH: 2.77 u[IU]/mL (ref 0.35–5.50)

## 2020-11-10 LAB — HEMOGLOBIN A1C: Hgb A1c MFr Bld: 5.5 % (ref 4.6–6.5)

## 2020-11-10 NOTE — Progress Notes (Signed)
Subjective:  Patient ID: Kyle Swanson, male    DOB: 1965/04/01  Age: 55 y.o. MRN: [id number]  CC:  Chief Complaint  Patient presents with   Annual Exam    Pt here for physical, no refills needed, pt is fasting today     HPI Nain Brasington presents for   Physical exam  BPH with lower urinary tract symptoms, nocturia Followed by [facility].  Last visit May 2.  Continued tamsulosin.  Stable voiding symptoms. Told he had normal PSA, and had DRE -  Allergic rhinitis Flonase, Claritin over-the-counter as needed - seasonal. Working ok.   Cancer screening Colonoscopy 10/31/2017, repeat 5 years. PSA has been normal, followed by urology. DRE and PSA at May visit.  Lab Results  Component Value Date   PSA1 0.7 01/02/2019   PSA1 0.8 08/26/2018   PSA1 1.1 06/27/2017   PSA 0.83 01/31/2015   PSA 1.16 10/06/2012   PSA 1.11 04/28/2012   Immunization History  Administered Date(s) Administered   Influenza, Seasonal, Injecte, Preservative Fre 04/28/2012   Influenza-Unspecified 12/25/2014, 01/19/2016, 01/04/2017, 12/07/2017   Moderna Sars-Covid-2 Vaccination 06/04/2019, 07/02/2019, 02/22/2020   Tdap 04/28/2012   Zoster Recombinat (Shingrix) 10/11/2017, 12/07/2017  2nd covid booster recommended.  Had covid infection end of July.   Depression screen [facility] 2/9 11/10/2020 01/12/2019 01/02/2019 06/27/2017 08/09/2016  Decreased Interest 0 0 0 0 0  Down, Depressed, Hopeless 0 0 0 0 0  PHQ - 2 Score 0 0 0 0 0  Altered sleeping 1 - - - -  Tired, decreased energy 1 - - - -  Change in appetite 0 - - - -  Feeling bad or failure about yourself  0 - - - -  Trouble concentrating 0 - - - -  Moving slowly or fidgety/restless 0 - - - -  Suicidal thoughts 0 - - - -  PHQ-9 Score 2 - - - -  Difficulty with sleep: related to tamsulosin - works most of the time - waking up to urinate. Trouble staying asleep at times.   Vision Screening   Right eye Left eye Both eyes  Without correction     With  correction '20/20 20/20 20/15 '$  Optho appt earlier this year. [facility].   Alcohol:  Few beers per week.   Tobacco none  Dentist  Every 6 months.   Exercise Body mass index is 27.48 kg/m. Less exercise recently - getting back into exercise.  S/p R hip therapy, neuropathy in toes - cortisone injections. Dr. Rip Harbour.   History Patient Active Problem List   Diagnosis Date Noted   General weakness 08/09/2016   Viral illness 08/09/2016   Palpitations 01/05/2013   Allergy    Asthma    Allergic rhinitis 04/28/2012   Routine health maintenance 09/06/2011   Past Medical History:  Diagnosis Date   Allergic rhinitis 04/28/2012   Allergy    Anxiety    Asthma    as a child - no meds or inhalers now   Closed fracture of right distal radius    Past Surgical History:  Procedure Laterality Date   GANGLION CYST EXCISION     left wrist 10 years ago   OPEN REDUCTION INTERNAL FIXATION (ORIF) DISTAL RADIAL FRACTURE Right 02/11/2019   Procedure: OPEN REDUCTION INTERNAL FIXATION (ORIF) DISTAL RADIAL FRACTURE;  Surgeon: Milly Jakob, MD;  Location: [place];  Service: Orthopedics;  Laterality: Right;  SURGERY REQUEST TIME: 90 MINUTES PRE OP REGIONAL   TONSILLECTOMY AND ADENOIDECTOMY  childhood   Allergies  Allergen Reactions   Sulfa Antibiotics Rash   Prior to Admission medications   Medication Sig Start Date End Date Taking? Authorizing Provider  fluticasone (FLONASE) 50 MCG/ACT nasal spray Place into both nostrils daily.   Yes [provider]  loratadine (CLARITIN) 10 MG tablet Take 10 mg by mouth daily.   Yes [provider]  tamsulosin (FLOMAX) 0.4 MG CAPS capsule Take 1 capsule by mouth daily. 07/27/19  Yes [provider]   Social History   Socioeconomic History   Marital status: Divorced    Spouse name: Not on file   Number of children: Not on file   Years of education: Not on file   Highest education level: Not  on file  Occupational History   Occupation: Ambulance person Fabrication  Tobacco Use   Smoking status: Never   Smokeless tobacco: Never  Vaping Use   Vaping Use: Never used  Substance and Sexual Activity   Alcohol use: Yes    Alcohol/week: 4.0 - 5.0 standard drinks    Types: 4 - 5 Cans of beer per week    Comment: BEER   Drug use: No   Sexual activity: Yes    Comment: number of sex partners in the last 12 months - 4  Other Topics Concern   Not on file  Social History Narrative   Divorced. Education: [employer]. Exercise: No.   Social Determinants of Health   Financial Resource Strain: Not on file  Food Insecurity: Not on file  Transportation Needs: Not on file  Physical Activity: Not on file  Stress: Not on file  Social Connections: Not on file  Intimate Partner Violence: Not on file    Review of Systems 13 point review of systems per patient health survey noted.  Negative other than as indicated above or in HPI.    Objective:   Vitals:   11/10/20 0754  BP: 122/74  Pulse: 95  Resp: 15  Temp: 98.3 F (36.8 C)  TempSrc: Temporal  SpO2: 95%  Weight: 214 lb (97.1 kg)  Height: '6\' 2"'$  (1.88 m)     Physical Exam Vitals reviewed.  Constitutional:      Appearance: He is well-developed.  HENT:     Head: Normocephalic and atraumatic.     Right Ear: External ear normal.     Left Ear: External ear normal.  Eyes:     Conjunctiva/sclera: Conjunctivae normal.     Pupils: Pupils are equal, round, and reactive to light.  Neck:     Thyroid: No thyromegaly.  Cardiovascular:     Rate and Rhythm: Normal rate and regular rhythm.     Heart sounds: Normal heart sounds.  Pulmonary:     Effort: Pulmonary effort is normal. No respiratory distress.     Breath sounds: Normal breath sounds. No wheezing.  Abdominal:     General: There is no distension.     Palpations: Abdomen is soft.     Tenderness: There is no abdominal tenderness.  Musculoskeletal:        General: No tenderness.  Normal range of motion.     Cervical back: Normal range of motion and neck supple.  Lymphadenopathy:     Cervical: No cervical adenopathy.  Skin:    General: Skin is warm and dry.  Neurological:     Mental Status: He is alert and oriented to person, place, and time.     Deep Tendon Reflexes: Reflexes are normal and symmetric.  Psychiatric:  Behavior: Behavior normal.       Assessment & Plan:  Johua Espejel is a 55 y.o. male . Annual physical exam - Plan: Comprehensive metabolic panel, Lipid panel, Hemoglobin A1c, CBC with Differential/Platelet  - -anticipatory guidance as below in AVS, screening labs above. Health maintenance items as above in HPI discussed/recommended as applicable.   Urinary frequency Urinary urgency Nocturia  -BPH with LUTS, stable on Flomax with occasional nocturia.  Treated by urology.  Continue same.  Screening for deficiency anemia - Plan: CBC with Differential/Platelet  Screening for diabetes mellitus - Plan: Comprehensive metabolic panel, Hemoglobin A1c  Screening for hyperlipidemia - Plan: Comprehensive metabolic panel, Lipid panel  Screening for thyroid disorder - Plan: TSH   No orders of the defined types were placed in this encounter.  Patient Instructions  Thanks for coming in today.  Continue follow-up with urology, orthopedics as planned but let me know if there are questions.  If there are any concerns on your labs I will let you know.  Take care.  Preventive Care 20-47 Years Old, Male Preventive care refers to lifestyle choices and visits with your health care provider that can promote health and wellness. This includes: A yearly physical exam. This is also called an annual wellness visit. Regular dental and eye exams. Immunizations. Screening for certain conditions. Healthy lifestyle choices, such as: Eating a healthy diet. Getting regular exercise. Not using drugs or products that contain nicotine and tobacco. Limiting  alcohol use. What can I expect for my preventive care visit? Physical exam Your health care provider will check your: Height and weight. These may be used to calculate your BMI (body mass index). BMI is a measurement that tells if you are at a healthy weight. Heart rate and blood pressure. Body temperature. Skin for abnormal spots. Counseling Your health care provider may ask you questions about your: Past medical problems. Family's medical history. Alcohol, tobacco, and drug use. Emotional well-being. Home life and relationship well-being. Sexual activity. Diet, exercise, and sleep habits. Work and work Statistician. Access to firearms. What immunizations do I need?  Vaccines are usually given at various ages, according to a schedule. Your health care provider will recommend vaccines for you based on your age, medicalhistory, and lifestyle or other factors, such as travel or where you work. What tests do I need? Blood tests Lipid and cholesterol levels. These may be checked every 5 years, or more often if you are over 94 years old. Hepatitis C test. Hepatitis B test. Screening Lung cancer screening. You may have this screening every year starting at age 20 if you have a 30-pack-year history of smoking and currently smoke or have quit within the past 15 years. Prostate cancer screening. Recommendations will vary depending on your family history and other risks. Genital exam to check for testicular cancer or hernias. Colorectal cancer screening. All adults should have this screening starting at age 47 and continuing until age 97. Your health care provider may recommend screening at age 60 if you are at increased risk. You will have tests every 1-10 years, depending on your results and the type of screening test. Diabetes screening. This is done by checking your blood sugar (glucose) after you have not eaten for a while (fasting). You may have this done every 1-3 years. STD  (sexually transmitted disease) testing, if you are at risk. Follow these instructions at home: Eating and drinking  Eat a diet that includes fresh fruits and vegetables, whole grains, lean protein, and low-fat  dairy products. Take vitamin and mineral supplements as recommended by your health care provider. Do not drink alcohol if your health care provider tells you not to drink. If you drink alcohol: Limit how much you have to 0-2 drinks a day. Be aware of how much alcohol is in your drink. In the [place] one drink equals one 12 oz bottle of beer (355 mL), one 5 oz glass of wine (148 mL), or one 1 oz glass of hard liquor (44 mL).  Lifestyle Take daily care of your teeth and gums. Brush your teeth every morning and night with fluoride toothpaste. Floss one time each day. Stay active. Exercise for at least 30 minutes 5 or more days each week. Do not use any products that contain nicotine or tobacco, such as cigarettes, e-cigarettes, and chewing tobacco. If you need help quitting, ask your health care provider. Do not use drugs. If you are sexually active, practice safe sex. Use a condom or other form of protection to prevent STIs (sexually transmitted infections). If told by your health care provider, take low-dose aspirin daily starting at age 22. Find healthy ways to cope with stress, such as: Meditation, yoga, or listening to music. Journaling. Talking to a trusted person. Spending time with friends and family. Safety Always wear your seat belt while driving or riding in a vehicle. Do not drive: If you have been drinking alcohol. Do not ride with someone who has been drinking. When you are tired or distracted. While texting. Wear a helmet and other protective equipment during sports activities. If you have firearms in your house, make sure you follow all gun safety procedures. What's next? Go to your health care provider once a year for an annual wellness visit. Ask your health care  provider how often you should have your eyes and teeth checked. Stay up to date on all vaccines. This information is not intended to replace advice given to you by your health care provider. Make sure you discuss any questions you have with your healthcare provider. Document Revised: 12/09/2018 Document Reviewed: 03/06/2018 Elsevier Patient Education  2022 [place],   [name], MD [place] 11/10/20 8:36 AM

## 2020-11-10 NOTE — Patient Instructions (Signed)
Thanks for coming in today.  Continue follow-up with urology, orthopedics as planned but let me know if there are questions.  If there are any concerns on your labs I will let you know.  Take care.  Preventive Care 88-54 Years Old, Male Preventive care refers to lifestyle choices and visits with your health care provider that can promote health and wellness. This includes: A yearly physical exam. This is also called an annual wellness visit. Regular dental and eye exams. Immunizations. Screening for certain conditions. Healthy lifestyle choices, such as: Eating a healthy diet. Getting regular exercise. Not using drugs or products that contain nicotine and tobacco. Limiting alcohol use. What can I expect for my preventive care visit? Physical exam Your health care provider will check your: Height and weight. These may be used to calculate your BMI (body mass index). BMI is a measurement that tells if you are at a healthy weight. Heart rate and blood pressure. Body temperature. Skin for abnormal spots. Counseling Your health care provider may ask you questions about your: Past medical problems. Family's medical history. Alcohol, tobacco, and drug use. Emotional well-being. Home life and relationship well-being. Sexual activity. Diet, exercise, and sleep habits. Work and work Statistician. Access to firearms. What immunizations do I need?  Vaccines are usually given at various ages, according to a schedule. Your health care provider will recommend vaccines for you based on your age, medicalhistory, and lifestyle or other factors, such as travel or where you work. What tests do I need? Blood tests Lipid and cholesterol levels. These may be checked every 5 years, or more often if you are over 30 years old. Hepatitis C test. Hepatitis B test. Screening Lung cancer screening. You may have this screening every year starting at age 36 if you have a 30-pack-year history of smoking and  currently smoke or have quit within the past 15 years. Prostate cancer screening. Recommendations will vary depending on your family history and other risks. Genital exam to check for testicular cancer or hernias. Colorectal cancer screening. All adults should have this screening starting at age 60 and continuing until age 33. Your health care provider may recommend screening at age 80 if you are at increased risk. You will have tests every 1-10 years, depending on your results and the type of screening test. Diabetes screening. This is done by checking your blood sugar (glucose) after you have not eaten for a while (fasting). You may have this done every 1-3 years. STD (sexually transmitted disease) testing, if you are at risk. Follow these instructions at home: Eating and drinking  Eat a diet that includes fresh fruits and vegetables, whole grains, lean protein, and low-fat dairy products. Take vitamin and mineral supplements as recommended by your health care provider. Do not drink alcohol if your health care provider tells you not to drink. If you drink alcohol: Limit how much you have to 0-2 drinks a day. Be aware of how much alcohol is in your drink. In the [place] one drink equals one 12 oz bottle of beer (355 mL), one 5 oz glass of wine (148 mL), or one 1 oz glass of hard liquor (44 mL).  Lifestyle Take daily care of your teeth and gums. Brush your teeth every morning and night with fluoride toothpaste. Floss one time each day. Stay active. Exercise for at least 30 minutes 5 or more days each week. Do not use any products that contain nicotine or tobacco, such as cigarettes, e-cigarettes, and chewing tobacco.  If you need help quitting, ask your health care provider. Do not use drugs. If you are sexually active, practice safe sex. Use a condom or other form of protection to prevent STIs (sexually transmitted infections). If told by your health care provider, take low-dose aspirin  daily starting at age 43. Find healthy ways to cope with stress, such as: Meditation, yoga, or listening to music. Journaling. Talking to a trusted person. Spending time with friends and family. Safety Always wear your seat belt while driving or riding in a vehicle. Do not drive: If you have been drinking alcohol. Do not ride with someone who has been drinking. When you are tired or distracted. While texting. Wear a helmet and other protective equipment during sports activities. If you have firearms in your house, make sure you follow all gun safety procedures. What's next? Go to your health care provider once a year for an annual wellness visit. Ask your health care provider how often you should have your eyes and teeth checked. Stay up to date on all vaccines. This information is not intended to replace advice given to you by your health care provider. Make sure you discuss any questions you have with your healthcare provider. Document Revised: 12/09/2018 Document Reviewed: 03/06/2018 Elsevier Patient Education  2022 [employer].

## 2020-11-22 ENCOUNTER — Other Ambulatory Visit: Payer: Self-pay | Admitting: Family Medicine

## 2020-11-22 DIAGNOSIS — R7989 Other specified abnormal findings of blood chemistry: Secondary | ICD-10-CM

## 2020-11-22 NOTE — Progress Notes (Signed)
Lab only visit for elevated LFTs.

## 2020-12-08 ENCOUNTER — Other Ambulatory Visit (INDEPENDENT_AMBULATORY_CARE_PROVIDER_SITE_OTHER): Payer: 59

## 2020-12-08 ENCOUNTER — Other Ambulatory Visit: Payer: Self-pay

## 2020-12-08 DIAGNOSIS — R7989 Other specified abnormal findings of blood chemistry: Secondary | ICD-10-CM | POA: Diagnosis not present

## 2020-12-08 LAB — HEPATIC FUNCTION PANEL
ALT: 175 U/L — ABNORMAL HIGH (ref 0–53)
AST: 66 U/L — ABNORMAL HIGH (ref 0–37)
Albumin: 4.4 g/dL (ref 3.5–5.2)
Alkaline Phosphatase: 65 U/L (ref 39–117)
Bilirubin, Direct: 0.1 mg/dL (ref 0.0–0.3)
Total Bilirubin: 0.7 mg/dL (ref 0.2–1.2)
Total Protein: 6.7 g/dL (ref 6.0–8.3)

## 2020-12-26 ENCOUNTER — Ambulatory Visit (INDEPENDENT_AMBULATORY_CARE_PROVIDER_SITE_OTHER): Payer: 59 | Admitting: Family Medicine

## 2020-12-26 ENCOUNTER — Other Ambulatory Visit: Payer: Self-pay

## 2020-12-26 VITALS — BP 128/64 | HR 92 | Temp 98.3°F | Resp 16 | Ht 74.0 in | Wt 212.6 lb

## 2020-12-26 DIAGNOSIS — E785 Hyperlipidemia, unspecified: Secondary | ICD-10-CM

## 2020-12-26 DIAGNOSIS — R1032 Left lower quadrant pain: Secondary | ICD-10-CM

## 2020-12-26 DIAGNOSIS — R7989 Other specified abnormal findings of blood chemistry: Secondary | ICD-10-CM

## 2020-12-26 DIAGNOSIS — Z8249 Family history of ischemic heart disease and other diseases of the circulatory system: Secondary | ICD-10-CM

## 2020-12-26 DIAGNOSIS — N451 Epididymitis: Secondary | ICD-10-CM

## 2020-12-26 MED ORDER — LEVOFLOXACIN 500 MG PO TABS: 500.0000 mg | ORAL_TABLET | Freq: Every day | ORAL | 0 refills | Status: AC

## 2020-12-26 NOTE — Patient Instructions (Addendum)
I will order coronary calcium scoring, other labs for liver tests as well as ultrasound for elevated liver test.  If any abdominal pain, nausea, vomiting or new symptoms be seen right away but I suspect that levels may be due to some of the increased weight.  Should improve as weight improves.  If groin/testicular pain not improved in next day or two, can start antibiotic for the possible early epididymitis.  If the groin pain, testicular pain does not improve into next week or any worsening sooner please return for recheck.  I did not appreciate any hernia on exam today but certainly can do some imaging of that area if that pain persists.    Epididymitis Epididymitis is swelling (inflammation) or infection of the epididymis. The epididymis is a cord-like structure that is located along the top and back part of the testicle. It collects and stores sperm from the testicle. This condition can also cause pain and swelling of the testicle and scrotum. Symptoms usually start suddenly (acute epididymitis). Sometimes epididymitis starts gradually and lasts for a while (chronic epididymitis). This type may be harder to treat. What are the causes? In men ages 86-40, this condition is usually caused by a bacterial infection or a sexually transmitted disease (STD), such as: Gonorrhea. Chlamydia. In men 57 and older who do not have anal sex, this condition is usually caused by bacteria from a blockage or from abnormalities in the urinary system. These can result from: Having a tube placed into the bladder (urinary catheter). Having an enlarged or inflamed prostate gland. Having recently had urinary tract surgery. Having a problem with a backward flow of urine (retrograde). In men who have a condition that weakens the body's defense system (immune system), such as HIV, this condition can be caused by: Other bacteria, including tuberculosis and syphilis. Viruses. Fungi. Sometimes this condition occurs  without infection. This may happen because of trauma or repetitive activities such as sports. What increases the risk? You are more likely to develop this condition if you have: Unprotected sex with more than one partner. Anal sex. Recently had surgery. A urinary catheter. Urinary problems. A suppressed immune system. What are the signs or symptoms? This condition usually begins suddenly with chills, fever, and pain behind the scrotum and in the testicle. Other symptoms include: Swelling of the scrotum, testicle, or both. Pain when ejaculating or urinating. Pain in the back or abdomen. Nausea. Itching and discharge from the penis. A frequent need to pass urine. Redness, increased warmth, and tenderness of the scrotum. How is this diagnosed? Your health care provider can diagnose this condition based on your symptoms and medical history. Your health care provider will also do a physical exam to ask about your symptoms and check your scrotum and testicle for swelling, pain, and redness. You may also have other tests, including: Examination of discharge from the penis. Urine tests for infections, such as STDs. Ultrasound test for blood flow and inflammation. Your health care provider may test you for other STDs, including HIV. How is this treated? Treatment for this condition depends on the cause. If your condition is caused by a bacterial infection, oral antibiotic medicine may be prescribed. If the bacterial infection has spread to your blood, you may need to receive IV antibiotics. For both bacterial and nonbacterial epididymitis, you may be treated with: Rest. Elevation of the scrotum. Pain medicines. Anti-inflammatory medicines. Surgery may be needed to treat: Bacterial epididymitis that causes pus to build up in the scrotum (abscess). Chronic  epididymitis that has not responded to other treatments. Follow these instructions at home: Medicines Take over-the-counter and  prescription medicines only as told by your health care provider. If you were prescribed an antibiotic medicine, take it as told by your health care provider. Do not stop taking the antibiotic even if your condition improves. Sexual activity If your epididymitis was caused by an STD, avoid sexual activity until your treatment is complete. Inform your sexual partner or partners if you test positive for an STD. They may need to be treated. Do not engage in sexual activity with your partner or partners until their treatment is completed. Managing pain and swelling  If directed, elevate your scrotum and apply ice. Put ice in a plastic bag. Place a small towel or pillow between your legs. Rest your scrotum on the pillow or towel. Place another towel between your skin and the plastic bag. Leave the ice on for 20 minutes, 2-3 times a day. Try taking a sitz bath to help with discomfort. This is a warm water bath that is taken while you are sitting down. The water should only come up to your hips and should cover your buttocks. Do this 3-4 times per day or as told by your health care provider. Keep your scrotum elevated and supported while resting. Ask your health care provider if you should wear a scrotal support, such as a jockstrap. Wear it as told by your health care provider. General instructions Return to your normal activities as told by your health care provider. Ask your health care provider what activities are safe for you. Drink enough fluid to keep your urine pale yellow. Keep all follow-up visits as told by your health care provider. This is important. Contact a health care provider if: You have a fever. Your pain medicine is not helping. Your pain is getting worse. Your symptoms do not improve within 3 days. Summary Epididymitis is swelling (inflammation) or infection of the epididymis. This condition can also cause pain and swelling of the testicle and scrotum. Treatment for this  condition depends on the cause. If your condition is caused by a bacterial infection, oral antibiotic medicine may be prescribed. Inform your sexual partner or partners if you test positive for an STD. They may need to be treated. Do not engage in sexual activity with your partner or partners until their treatment is completed. Contact a health care provider if your symptoms do not improve within 3 days. This information is not intended to replace advice given to you by your health care provider. Make sure you discuss any questions you have with your health care provider. Document Revised: 05/25/2020 Document Reviewed: 01/14/2018 Elsevier Patient Education  2022 [employer].

## 2020-12-26 NOTE — Progress Notes (Signed)
Subjective:  Patient ID: Kyle Swanson, male    DOB: [birth year]  Age: 55 y.o. MRN: [id number]  CC:  Chief Complaint  Patient presents with   Hernia    Feeling of pulled muscle in Lt groin/thigh started on Thursday, notes no bulging but most painful upon standing after sitting for some time.     lab work    Pt here fo follow up elevated liver testing   heart health    Pt would like to know about calcium score testing for around the heart due to some recent family having heart concerns     HPI Kyle Swanson presents for   Concerns above.  Groin pain: Started 4 days ago. No known injury. Noticed with standing up. Pain in left groin to left scrotal area. No rash or skin lesions/redness. No heavy lifting. Some soreness with bending.  No dysuria. No testicular pain, or swelling.  No n/v stool changes.  No dysuria. Some recent urinary urgency past week or two. no hematuria. No new sexual partners, less active recently.   Elevated LFTs: Noted on August 18 then again in September 15 labs.  Previous AST, ALT were 19, 13 January 2019.  Weight is approximately 21 pounds higher than November 2020. Alcohol: few beers week, not daily, no recent increase - decreased amount since elevated tests. Did have elevated readings in past with higher weight that improved with weight loss.  Tylenol:none usually No yellowing of skin/eyes.  No ad pain/n/v.  Improved diet past month.   Lab Results  Component Value Date   ALT 175 (H) 12/08/2020   AST 66 (H) 12/08/2020   ALKPHOS 65 12/08/2020   BILITOT 0.7 12/08/2020   Wt Readings from Last 3 Encounters:  12/26/20 212 lb 9.6 oz (96.4 kg)  11/10/20 214 lb (97.1 kg)  02/11/19 191 lb 5.8 oz (86.8 kg)   Hyperlipidemia: No current statin, he does have some concerns regarding coronary calcium scoring given family history of heart issues.  Father with some heart disease, but prior heavy smoker.  The 10-year ASCVD risk score (Arnett DK, et al., 2019)  is: 4.3%   Values used to calculate the score:     Age: 5 years     Sex: Male     Is Non-Hispanic African American: No     Diabetic: No     Tobacco smoker: No     Systolic Blood Pressure: 785 mmHg     Is BP treated: No     HDL Cholesterol: 54.3 mg/dL     Total Cholesterol: 201 mg/dL  Stepfather with heart issues.  Lab Results  Component Value Date   CHOL 201 (H) 11/10/2020   HDL 54.30 11/10/2020   LDLCALC 120 (H) 11/10/2020   TRIG 135.0 11/10/2020   CHOLHDL 4 11/10/2020   Lab Results  Component Value Date   ALT 175 (H) 12/08/2020   AST 66 (H) 12/08/2020   ALKPHOS 65 12/08/2020   BILITOT 0.7 12/08/2020       History Patient Active Problem List   Diagnosis Date Noted   General weakness 08/09/2016   Viral illness 08/09/2016   Palpitations 01/05/2013   Allergy    Asthma    Allergic rhinitis 04/28/2012   Routine health maintenance 09/06/2011   Past Medical History:  Diagnosis Date   Allergic rhinitis 04/28/2012   Allergy    Anxiety    Asthma    as a child - no meds or inhalers now   Closed fracture of  right distal radius    Past Surgical History:  Procedure Laterality Date   GANGLION CYST EXCISION     left wrist 10 years ago   OPEN REDUCTION INTERNAL FIXATION (ORIF) DISTAL RADIAL FRACTURE Right 02/11/2019   Procedure: OPEN REDUCTION INTERNAL FIXATION (ORIF) DISTAL RADIAL FRACTURE;  Surgeon: Milly Jakob, MD;  Location: [place];  Service: Orthopedics;  Laterality: Right;  SURGERY REQUEST TIME: 90 MINUTES PRE OP REGIONAL   TONSILLECTOMY AND ADENOIDECTOMY     childhood   Allergies  Allergen Reactions   Sulfa Antibiotics Rash   Prior to Admission medications   Medication Sig Start Date End Date Taking? Authorizing Provider  B Complex-C (SUPER B COMPLEX PO)  12/07/20  Yes [provider]  Cholecalciferol (VITAMIN D3) 10 MCG (400 UNIT) CAPS  12/07/20  Yes [provider]  fluticasone (FLONASE) 50 MCG/ACT nasal spray Place  into both nostrils daily.   Yes [provider]  loratadine (CLARITIN) 10 MG tablet Take 10 mg by mouth daily.   Yes [provider]  Omega-3 Fatty Acids (FISH OIL) 1000 MG CAPS  12/07/20  Yes [provider]  tamsulosin (FLOMAX) 0.4 MG CAPS capsule Take 1 capsule by mouth daily. 07/27/19  Yes [provider]   Social History   Socioeconomic History   Marital status: Divorced    Spouse name: Not on file   Number of children: Not on file   Years of education: Not on file   Highest education level: Not on file  Occupational History   Occupation: Ambulance person Fabrication  Tobacco Use   Smoking status: Never   Smokeless tobacco: Never  Vaping Use   Vaping Use: Never used  Substance and Sexual Activity   Alcohol use: Yes    Alcohol/week: 4.0 - 5.0 standard drinks    Types: 4 - 5 Cans of beer per week    Comment: BEER   Drug use: No   Sexual activity: Yes    Comment: number of sex partners in the last 12 months - 4  Other Topics Concern   Not on file  Social History Narrative   Divorced. Education: [employer]. Exercise: No.   Social Determinants of Health   Financial Resource Strain: Not on file  Food Insecurity: Not on file  Transportation Needs: Not on file  Physical Activity: Not on file  Stress: Not on file  Social Connections: Not on file  Intimate Partner Violence: Not on file    Review of Systems Per HPI.   Objective:   Vitals:   12/26/20 1505  BP: 128/64  Pulse: 92  Resp: 16  Temp: 98.3 F (36.8 C)  TempSrc: Temporal  SpO2: 93%  Weight: 212 lb 9.6 oz (96.4 kg)  Height: 6\' 2"  (1.88 m)     Physical Exam Vitals reviewed.  Constitutional:      Appearance: He is well-developed.  HENT:     Head: Normocephalic and atraumatic.  Neck:     Vascular: No carotid bruit or JVD.  Cardiovascular:     Rate and Rhythm: Normal rate and regular rhythm.     Heart sounds: Normal heart sounds. No murmur heard. Pulmonary:     Effort:  Pulmonary effort is normal.     Breath sounds: Normal breath sounds. No rales.  Abdominal:     General: Abdomen is flat. There is no distension.     Palpations: Abdomen is soft. There is no mass.     Tenderness: There is no abdominal tenderness.  There is no guarding.  Genitourinary:    Comments: No apparent inguinal/femoral hernia including with Valsalva/cough.  Slight discomfort at the left epididymis without apparent swelling/testicular swelling, rash, discharge or genital lesions.  No apparent inguinal lymphadenopathy. Musculoskeletal:     Right lower leg: No edema.     Left lower leg: No edema.  Skin:    General: Skin is warm and dry.  Neurological:     Mental Status: He is alert and oriented to person, place, and time.  Psychiatric:        Mood and Affect: Mood normal.       Assessment & Plan:  Daveion Robar is a 55 y.o. male . Hyperlipidemia, unspecified hyperlipidemia type - Plan: CT CARDIAC SCORING (SELF PAY ONLY) Family history of heart disease - Plan: CT CARDIAC SCORING (SELF PAY ONLY)  -Check labs, did order CT cardiac scoring for coronary calcium score at his request.  May help on decision-making for statin.  Elevated LFTs - Plan: Hepatic function panel, Hepatitis, Acute, US Abdomen Limited RUQ (LIVER/GB)  -Suspect hepatic steatosis, with reported prior elevations improved with weight loss.  Check LFTs, acute hepatitis panel, ultrasound.  Asymptomatic.  RTC precautions.  Left groin pain Epididymitis - Plan: levofloxacin (LEVAQUIN) 500 MG tablet  -Minimal discomfort but did have some slight discomfort at the left epididymis.  Possible early epididymitis with referred pain, less likely hernia without appreciated hernia on exam, but could consider imaging if persistent symptoms.  Start Levaquin if persistent discomfort in the next 24 to 48 hours, potential side effects discussed including tendinopathy/rupture risks.  Understanding expressed.  RTC precautions.  Meds  ordered this encounter  Medications   levofloxacin (LEVAQUIN) 500 MG tablet    Sig: Take 1 tablet (500 mg total) by mouth daily for 10 days.    Dispense:  7 tablet    Refill:  0   Patient Instructions  I will order coronary calcium scoring, other labs for liver tests as well as ultrasound for elevated liver test.  If any abdominal pain, nausea, vomiting or new symptoms be seen right away but I suspect that levels may be due to some of the increased weight.  Should improve as weight improves.  If groin/testicular pain not improved in next day or two, can start antibiotic for the possible early epididymitis.  If the groin pain, testicular pain does not improve into next week or any worsening sooner please return for recheck.  I did not appreciate any hernia on exam today but certainly can do some imaging of that area if that pain persists.    Epididymitis Epididymitis is swelling (inflammation) or infection of the epididymis. The epididymis is a cord-like structure that is located along the top and back part of the testicle. It collects and stores sperm from the testicle. This condition can also cause pain and swelling of the testicle and scrotum. Symptoms usually start suddenly (acute epididymitis). Sometimes epididymitis starts gradually and lasts for a while (chronic epididymitis). This type may be harder to treat. What are the causes? In men ages 48-40, this condition is usually caused by a bacterial infection or a sexually transmitted disease (STD), such as: Gonorrhea. Chlamydia. In men 58 and older who do not have anal sex, this condition is usually caused by bacteria from a blockage or from abnormalities in the urinary system. These can result from: Having a tube placed into the bladder (urinary catheter). Having an enlarged or inflamed prostate gland. Having recently had urinary tract surgery. Having  a problem with a backward flow of urine (retrograde). In men who have a condition  that weakens the body's defense system (immune system), such as HIV, this condition can be caused by: Other bacteria, including tuberculosis and syphilis. Viruses. Fungi. Sometimes this condition occurs without infection. This may happen because of trauma or repetitive activities such as sports. What increases the risk? You are more likely to develop this condition if you have: Unprotected sex with more than one partner. Anal sex. Recently had surgery. A urinary catheter. Urinary problems. A suppressed immune system. What are the signs or symptoms? This condition usually begins suddenly with chills, fever, and pain behind the scrotum and in the testicle. Other symptoms include: Swelling of the scrotum, testicle, or both. Pain when ejaculating or urinating. Pain in the back or abdomen. Nausea. Itching and discharge from the penis. A frequent need to pass urine. Redness, increased warmth, and tenderness of the scrotum. How is this diagnosed? Your health care provider can diagnose this condition based on your symptoms and medical history. Your health care provider will also do a physical exam to ask about your symptoms and check your scrotum and testicle for swelling, pain, and redness. You may also have other tests, including: Examination of discharge from the penis. Urine tests for infections, such as STDs. Ultrasound test for blood flow and inflammation. Your health care provider may test you for other STDs, including HIV. How is this treated? Treatment for this condition depends on the cause. If your condition is caused by a bacterial infection, oral antibiotic medicine may be prescribed. If the bacterial infection has spread to your blood, you may need to receive IV antibiotics. For both bacterial and nonbacterial epididymitis, you may be treated with: Rest. Elevation of the scrotum. Pain medicines. Anti-inflammatory medicines. Surgery may be needed to treat: Bacterial  epididymitis that causes pus to build up in the scrotum (abscess). Chronic epididymitis that has not responded to other treatments. Follow these instructions at home: Medicines Take over-the-counter and prescription medicines only as told by your health care provider. If you were prescribed an antibiotic medicine, take it as told by your health care provider. Do not stop taking the antibiotic even if your condition improves. Sexual activity If your epididymitis was caused by an STD, avoid sexual activity until your treatment is complete. Inform your sexual partner or partners if you test positive for an STD. They may need to be treated. Do not engage in sexual activity with your partner or partners until their treatment is completed. Managing pain and swelling  If directed, elevate your scrotum and apply ice. Put ice in a plastic bag. Place a small towel or pillow between your legs. Rest your scrotum on the pillow or towel. Place another towel between your skin and the plastic bag. Leave the ice on for 20 minutes, 2-3 times a day. Try taking a sitz bath to help with discomfort. This is a warm water bath that is taken while you are sitting down. The water should only come up to your hips and should cover your buttocks. Do this 3-4 times per day or as told by your health care provider. Keep your scrotum elevated and supported while resting. Ask your health care provider if you should wear a scrotal support, such as a jockstrap. Wear it as told by your health care provider. General instructions Return to your normal activities as told by your health care provider. Ask your health care provider what activities are safe for you.  Drink enough fluid to keep your urine pale yellow. Keep all follow-up visits as told by your health care provider. This is important. Contact a health care provider if: You have a fever. Your pain medicine is not helping. Your pain is getting worse. Your symptoms do not  improve within 3 days. Summary Epididymitis is swelling (inflammation) or infection of the epididymis. This condition can also cause pain and swelling of the testicle and scrotum. Treatment for this condition depends on the cause. If your condition is caused by a bacterial infection, oral antibiotic medicine may be prescribed. Inform your sexual partner or partners if you test positive for an STD. They may need to be treated. Do not engage in sexual activity with your partner or partners until their treatment is completed. Contact a health care provider if your symptoms do not improve within 3 days. This information is not intended to replace advice given to you by your health care provider. Make sure you discuss any questions you have with your health care provider. Document Revised: 05/25/2020 Document Reviewed: 01/14/2018 Elsevier Patient Education  2022 [place],   [name], MD [place] 12/27/20 10:46 AM

## 2020-12-27 ENCOUNTER — Encounter: Payer: Self-pay | Admitting: Family Medicine

## 2020-12-27 LAB — HEPATITIS PANEL, ACUTE
Hep A IgM: NONREACTIVE
Hep B C IgM: NONREACTIVE
Hepatitis B Surface Ag: NONREACTIVE
Hepatitis C Ab: NONREACTIVE
SIGNAL TO CUT-OFF: 0.07 (ref <1.00)

## 2021-01-05 ENCOUNTER — Ambulatory Visit
Admission: RE | Admit: 2021-01-05 | Discharge: 2021-01-05 | Disposition: A | Discharge status: Home / Self Care | Payer: 59 | Source: Ambulatory Visit | Attending: Family Medicine | Admitting: Family Medicine

## 2021-01-05 DIAGNOSIS — R7989 Other specified abnormal findings of blood chemistry: Secondary | ICD-10-CM

## 2021-01-18 ENCOUNTER — Other Ambulatory Visit: Payer: Self-pay

## 2021-01-18 ENCOUNTER — Ambulatory Visit (INDEPENDENT_AMBULATORY_CARE_PROVIDER_SITE_OTHER)
Admission: RE | Admit: 2021-01-18 | Discharge: 2021-01-18 | Disposition: A | Discharge status: Home / Self Care | Payer: Self-pay | Source: Ambulatory Visit | Attending: Family Medicine | Admitting: Family Medicine

## 2021-01-18 DIAGNOSIS — Z8249 Family history of ischemic heart disease and other diseases of the circulatory system: Secondary | ICD-10-CM

## 2021-01-18 DIAGNOSIS — E785 Hyperlipidemia, unspecified: Secondary | ICD-10-CM

## 2021-01-24 ENCOUNTER — Encounter: Payer: Self-pay | Admitting: Family Medicine

## 2021-01-24 DIAGNOSIS — E785 Hyperlipidemia, unspecified: Secondary | ICD-10-CM

## 2021-01-26 MED ORDER — ATORVASTATIN CALCIUM 10 MG PO TABS: 10.0000 mg | ORAL_TABLET | Freq: Every day | ORAL | 0 refills | Status: DC

## 2021-03-13 ENCOUNTER — Other Ambulatory Visit: Payer: Self-pay | Admitting: Orthopaedic Surgery

## 2021-03-13 DIAGNOSIS — M79671 Pain in right foot: Secondary | ICD-10-CM

## 2021-03-16 ENCOUNTER — Encounter: Payer: Self-pay | Admitting: Family Medicine

## 2021-03-16 ENCOUNTER — Ambulatory Visit (INDEPENDENT_AMBULATORY_CARE_PROVIDER_SITE_OTHER): Payer: 59 | Admitting: Family Medicine

## 2021-03-16 VITALS — BP 133/81 | HR 84 | Temp 98.3°F | Resp 18 | Ht 74.0 in | Wt 212.8 lb

## 2021-03-16 DIAGNOSIS — E785 Hyperlipidemia, unspecified: Secondary | ICD-10-CM | POA: Diagnosis not present

## 2021-03-16 DIAGNOSIS — R7989 Other specified abnormal findings of blood chemistry: Secondary | ICD-10-CM

## 2021-03-16 DIAGNOSIS — R051 Acute cough: Secondary | ICD-10-CM

## 2021-03-16 DIAGNOSIS — Z8249 Family history of ischemic heart disease and other diseases of the circulatory system: Secondary | ICD-10-CM | POA: Diagnosis not present

## 2021-03-16 DIAGNOSIS — B349 Viral infection, unspecified: Secondary | ICD-10-CM

## 2021-03-16 LAB — COMPREHENSIVE METABOLIC PANEL
ALT: 142 U/L — ABNORMAL HIGH (ref 0–53)
AST: 77 U/L — ABNORMAL HIGH (ref 0–37)
Albumin: 4.5 g/dL (ref 3.5–5.2)
Alkaline Phosphatase: 69 U/L (ref 39–117)
BUN: 18 mg/dL (ref 6–23)
CO2: 27 mEq/L (ref 19–32)
Calcium: 9.6 mg/dL (ref 8.4–10.5)
Chloride: 104 mEq/L (ref 96–112)
Creatinine, Ser: 0.98 mg/dL (ref 0.40–1.50)
GFR: 87.72 mL/min (ref >60.00)
Glucose, Bld: 80 mg/dL (ref 70–99)
Potassium: 4.1 mEq/L (ref 3.5–5.1)
Sodium: 139 mEq/L (ref 135–145)
Total Bilirubin: 1 mg/dL (ref 0.2–1.2)
Total Protein: 7.2 g/dL (ref 6.0–8.3)

## 2021-03-16 LAB — LIPID PANEL
Cholesterol: 161 mg/dL (ref 0–200)
HDL: 45.4 mg/dL (ref >39.00)
LDL Cholesterol: 95 mg/dL (ref 0–99)
NonHDL: 116.04
Total CHOL/HDL Ratio: 4
Triglycerides: 106 mg/dL (ref 0.0–149.0)
VLDL: 21.2 mg/dL (ref 0.0–40.0)

## 2021-03-16 MED ORDER — BENZONATATE 100 MG PO CAPS: 100.0000 mg | ORAL_CAPSULE | Freq: Three times a day (TID) | ORAL | 0 refills | Status: DC | PRN

## 2021-03-16 NOTE — Progress Notes (Signed)
Subjective:  Patient ID: Kyle Swanson, male    DOB: 31-Jan-1966  Age: 55 y.o. MRN: [id number]  CC:  Chief Complaint  Patient presents with   Follow-up    Patient states he is here for a follow up on his cholesterol and medication. Pt also has a dry cough that he had from being sick for 5 days about 2 weeks ago.pt was negative for covid.     HPI Kyle Swanson presents for   Cough: Chills, body aches, nausea 2 weeks ago. Diarrhea on day 4. Fever 101. Covid test at home negative on day 2 and 3. No medical eval. Had flu vaccine in September.  Sick contact with cough prior.  Cough started during that illness. Still some residu0al cough - dry cough intermittent. Overall better, intermittent cough. No recent fever or dyspnea.  Tx: robitussin.   Hyperlipidemia, with elevated LFTs. Discussed at October visit.  Did have history of elevated LFTs, and in the past those improved with weight loss.  Was improving his diet as of last visit. RUQ [place] 01/08/21: IMPRESSION: 1. Heterogeneous and diffusely increased hepatic parenchymal echogenicity typical of steatosis. 2. Multiple cysts throughout the liver, as well as subcentimeter lesions are too small to characterize, but favor cysts. 3. Gallbladder polyp measuring 3 mm. No further follow-up is needed given size. No gallstones. No abd pain, n/v or yellowing of skin/eyes.  Alcohol - 2-3 beers once per week. No tylenol.   ASCVD risk score was 4.3% based on prior lipids.  Did have family history of heart issues.  Father with heart disease but he was a heavy smoker.  Coronary calcium scoring was ordered -coronary calcium score was 3 with 52nd percentile on 01/18/21.  Started on Lipitor 10 mg daily around November 1st with coQ10. No myalgias, or side effects. Stopped for a week when had recent illness. Back on it now.    Lab Results  Component Value Date   CHOL 201 (H) 11/10/2020   HDL 54.30 11/10/2020   LDLCALC 120 (H) 11/10/2020   TRIG 135.0  11/10/2020   CHOLHDL 4 11/10/2020   Lab Results  Component Value Date   ALT 175 (H) 12/08/2020   AST 66 (H) 12/08/2020   ALKPHOS 65 12/08/2020   BILITOT 0.7 12/08/2020    History Patient Active Problem List   Diagnosis Date Noted   General weakness 08/09/2016   Viral illness 08/09/2016   Palpitations 01/05/2013   Allergy    Asthma    Allergic rhinitis 04/28/2012   Routine health maintenance 09/06/2011   Past Medical History:  Diagnosis Date   Allergic rhinitis 04/28/2012   Allergy    Anxiety    Asthma    as a child - no meds or inhalers now   Closed fracture of right distal radius    Past Surgical History:  Procedure Laterality Date   GANGLION CYST EXCISION     left wrist 10 years ago   OPEN REDUCTION INTERNAL FIXATION (ORIF) DISTAL RADIAL FRACTURE Right 02/11/2019   Procedure: OPEN REDUCTION INTERNAL FIXATION (ORIF) DISTAL RADIAL FRACTURE;  Surgeon: Milly Jakob, MD;  Location: [facility];  Service: Orthopedics;  Laterality: Right;  SURGERY REQUEST TIME: 90 MINUTES PRE OP REGIONAL   TONSILLECTOMY AND ADENOIDECTOMY     childhood   Allergies  Allergen Reactions   Sulfa Antibiotics Rash   Prior to Admission medications   Medication Sig Start Date End Date Taking? Authorizing Provider  atorvastatin (LIPITOR) 10 MG tablet Take 1 tablet (  10 mg total) by mouth daily. Can start few days per week and increase as tolerated. 01/26/21  Yes Wendie Agreste, MD  B Complex-C (SUPER B COMPLEX PO)  12/07/20  Yes [provider]  Cholecalciferol (VITAMIN D3) 10 MCG (400 UNIT) CAPS  12/07/20  Yes [provider]  fluticasone (FLONASE) 50 MCG/ACT nasal spray Place into both nostrils daily.   Yes [provider]  loratadine (CLARITIN) 10 MG tablet Take 10 mg by mouth daily.   Yes [provider]  Omega-3 Fatty Acids (FISH OIL) 1000 MG CAPS  12/07/20  Yes [provider]  tamsulosin (FLOMAX) 0.4 MG CAPS capsule Take 1 capsule  by mouth daily. 07/27/19  Yes [provider]   Social History   Socioeconomic History   Marital status: Divorced    Spouse name: Not on file   Number of children: Not on file   Years of education: Not on file   Highest education level: Not on file  Occupational History   Occupation: Ambulance person Fabrication  Tobacco Use   Smoking status: Never   Smokeless tobacco: Never  Vaping Use   Vaping Use: Never used  Substance and Sexual Activity   Alcohol use: Yes    Alcohol/week: 4.0 - 5.0 standard drinks    Types: 4 - 5 Cans of beer per week    Comment: BEER   Drug use: No   Sexual activity: Yes    Comment: number of sex partners in the last 12 months - 4  Other Topics Concern   Not on file  Social History Narrative   Divorced. Education: [employer]. Exercise: No.   Social Determinants of Health   Financial Resource Strain: Not on file  Food Insecurity: Not on file  Transportation Needs: Not on file  Physical Activity: Not on file  Stress: Not on file  Social Connections: Not on file  Intimate Partner Violence: Not on file    Review of Systems  Constitutional:  Negative for fatigue and unexpected weight change.  Eyes:  Negative for visual disturbance.  Respiratory:  Negative for cough, chest tightness and shortness of breath.   Cardiovascular:  Negative for chest pain, palpitations and leg swelling.  Gastrointestinal:  Negative for abdominal pain and blood in stool.  Neurological:  Negative for dizziness, light-headedness and headaches.    Objective:   Vitals:   03/16/21 1148  BP: 133/81  Pulse: 84  Resp: 18  Temp: 98.3 F (36.8 C)  TempSrc: Temporal  SpO2: 99%  Weight: 212 lb 12.8 oz (96.5 kg)  Height: 6\' 2"  (1.88 m)     Physical Exam Vitals reviewed.  Constitutional:      Appearance: He is well-developed.  HENT:     Head: Normocephalic and atraumatic.  Neck:     Vascular: No carotid bruit or JVD.  Cardiovascular:     Rate and Rhythm: Normal rate and  regular rhythm.     Heart sounds: Normal heart sounds. No murmur heard. Pulmonary:     Effort: Pulmonary effort is normal. No respiratory distress.     Breath sounds: Normal breath sounds. No stridor. No wheezing, rhonchi or rales.  Musculoskeletal:     Right lower leg: No edema.     Left lower leg: No edema.  Skin:    General: Skin is warm and dry.  Neurological:     Mental Status: He is alert and oriented to person, place, and time.  Psychiatric:        Mood  and Affect: Mood normal.       Assessment & Plan:  Kyle Swanson is a 55 y.o. male . Acute cough - Plan: benzonatate (TESSALON) 100 MG capsule Viral illness  -New issue.  RSV versus COVID versus flu with possible false negative COVID testing initially.  Overall symptoms have improved with some residual cough.  Postinfectious cough likely.  Overall reassuring exam with clear lungs.  Imaging/testing deferred at this time.  Tessalon Perles prescribed with RTC precautions if persistent or worsening cough.  Elevated LFTs - Plan: Comprehensive metabolic panel, Lipid panel  -Repeat labs, especially now he is on statin.  Likely hepatic steatosis on imaging.  Consider gastroenterology eval depending on labs.  Hyperlipidemia, unspecified hyperlipidemia type - Plan: Comprehensive metabolic panel, Lipid panel Tolerating Lipitor with co-Q10, check updated labs.  Family history of heart disease - Plan: Comprehensive metabolic panel, Lipid panel  -Statin as above.  Meds ordered this encounter  Medications   benzonatate (TESSALON) 100 MG capsule    Sig: Take 1 capsule (100 mg total) by mouth 3 (three) times daily as needed for cough.    Dispense:  20 capsule    Refill:  0   Patient Instructions   I will check your liver test and cholesterol levels again today.  If liver tests remain elevated it may be a good idea to meet with a liver specialist or gastroenterologist.  I will let you know.  No med changes for right now.  Cough is  likely from the previous viral illness which could have been flu, COVID, RSV or another virus.  As symptoms are improving and lungs are clear today I do not think any specific testing or x-rays needed at this time.  Tessalon Perles up to 3 times per day if needed for cough but expect that cough to be improving over the next week or 2.  If you have any worsening cough or new symptoms please return for recheck here or urgent care if needed.  Thanks for coming in today and take care.  If you have lab work done today you will be contacted with your lab results within the next 2 weeks.  If you have not heard from [place] then please contact us. The fastest way to get your results is to register for My Chart.   IF you received an x-ray today, you will receive an invoice from [facility]. Please contact [facility] at [phone] with questions or concerns regarding your invoice.   IF you received labwork today, you will receive an invoice from [facility]. Please contact [facility] at [phone] with questions or concerns regarding your invoice.   Our billing staff will not be able to assist you with questions regarding bills from these companies.  You will be contacted with the lab results as soon as they are available. The fastest way to get your results is to activate your My Chart account. Instructions are located on the last page of this paperwork. If you have not heard from [place] regarding the results in 2 weeks, please contact this office.        Signed,   [name], MD [place] 03/16/21 12:31 PM

## 2021-03-16 NOTE — Patient Instructions (Addendum)
°  I will check your liver test and cholesterol levels again today.  If liver tests remain elevated it may be a good idea to meet with a liver specialist or gastroenterologist.  I will let you know.  No med changes for right now.  Cough is likely from the previous viral illness which could have been flu, COVID, RSV or another virus.  As symptoms are improving and lungs are clear today I do not think any specific testing or x-rays needed at this time.  Tessalon Perles up to 3 times per day if needed for cough but expect that cough to be improving over the next week or 2.  If you have any worsening cough or new symptoms please return for recheck here or urgent care if needed.  Thanks for coming in today and take care.  If you have lab work done today you will be contacted with your lab results within the next 2 weeks.  If you have not heard from [place] then please contact us. The fastest way to get your results is to register for My Chart.   IF you received an x-ray today, you will receive an invoice from [facility]. Please contact [facility] at [phone] with questions or concerns regarding your invoice.   IF you received labwork today, you will receive an invoice from [facility]. Please contact [facility] at [phone] with questions or concerns regarding your invoice.   Our billing staff will not be able to assist you with questions regarding bills from these companies.  You will be contacted with the lab results as soon as they are available. The fastest way to get your results is to activate your My Chart account. Instructions are located on the last page of this paperwork. If you have not heard from [place] regarding the results in 2 weeks, please contact this office.

## 2021-04-07 ENCOUNTER — Ambulatory Visit
Admission: RE | Admit: 2021-04-07 | Discharge: 2021-04-07 | Disposition: A | Discharge status: Home / Self Care | Payer: 59 | Source: Ambulatory Visit | Attending: Orthopaedic Surgery | Admitting: Orthopaedic Surgery

## 2021-04-07 ENCOUNTER — Other Ambulatory Visit: Payer: Self-pay

## 2021-04-07 DIAGNOSIS — M79671 Pain in right foot: Secondary | ICD-10-CM

## 2021-05-15 ENCOUNTER — Telehealth: Payer: Self-pay

## 2021-05-15 DIAGNOSIS — R7989 Other specified abnormal findings of blood chemistry: Secondary | ICD-10-CM

## 2021-05-15 NOTE — Telephone Encounter (Signed)
-----   Message from Wendie Agreste, MD sent at 05/13/2021  3:26 PM EST ----- Please see prior notes.  I have not heard back from patient regarding his wishes, is it okay to refer to gastroenterology?

## 2021-06-06 ENCOUNTER — Other Ambulatory Visit: Payer: Self-pay | Admitting: Family Medicine

## 2021-06-06 DIAGNOSIS — E785 Hyperlipidemia, unspecified: Secondary | ICD-10-CM

## 2021-06-08 ENCOUNTER — Encounter: Payer: Self-pay | Admitting: Gastroenterology

## 2021-06-25 ENCOUNTER — Encounter: Payer: Self-pay | Admitting: Family Medicine

## 2021-06-27 ENCOUNTER — Ambulatory Visit: Payer: 59 | Admitting: Gastroenterology

## 2021-07-14 ENCOUNTER — Encounter: Payer: Self-pay | Admitting: Gastroenterology

## 2021-07-14 ENCOUNTER — Ambulatory Visit (INDEPENDENT_AMBULATORY_CARE_PROVIDER_SITE_OTHER): Payer: 59 | Admitting: Gastroenterology

## 2021-07-14 ENCOUNTER — Other Ambulatory Visit (INDEPENDENT_AMBULATORY_CARE_PROVIDER_SITE_OTHER): Payer: 59

## 2021-07-14 VITALS — BP 120/82 | HR 82 | Ht 74.0 in | Wt 211.0 lb

## 2021-07-14 DIAGNOSIS — K760 Fatty (change of) liver, not elsewhere classified: Secondary | ICD-10-CM | POA: Diagnosis not present

## 2021-07-14 DIAGNOSIS — R7989 Other specified abnormal findings of blood chemistry: Secondary | ICD-10-CM | POA: Diagnosis not present

## 2021-07-14 LAB — IBC PANEL
Iron: 102 ug/dL (ref 42–165)
Saturation Ratios: 30.7 % (ref 20.0–50.0)
TIBC: 331.8 ug/dL (ref 250.0–450.0)
Transferrin: 237 mg/dL (ref 212.0–360.0)

## 2021-07-14 LAB — FERRITIN: Ferritin: 715.9 ng/mL — ABNORMAL HIGH (ref 22.0–322.0)

## 2021-07-14 NOTE — Patient Instructions (Signed)
If you are age 55 or older, your body mass index should be between 23-30. Your Body mass index is 27.09 kg/m?Marland Kitchen If this is out of the aforementioned range listed, please consider follow up with your Primary Care Provider. ? ?If you are age 94 or younger, your body mass index should be between 19-25. Your Body mass index is 27.09 kg/m?Marland Kitchen If this is out of the aformentioned range listed, please consider follow up with your Primary Care Provider.  ? ?________________________________________________________ ? ?The [facility] providers would like to encourage you to use [facility] to communicate with providers for non-urgent requests or questions.  Due to long hold times on the telephone, sending your provider a message by [facility] may be a faster and more efficient way to get a response.  Please allow 48 business hours for a response.  Please remember that this is for non-urgent requests.  ?_______________________________________________________ ? ?Your provider has requested that you go to the basement level for lab work before leaving today. Press "B" on the elevator. The lab is located at the first door on the left as you exit the elevator. ? ?It was a pleasure to see you today! ? ?Thank you for trusting me with your gastrointestinal care!   ? ? ?

## 2021-07-14 NOTE — Progress Notes (Signed)
? ? ?[facility] Consult Note: ? ?History: ?Kyle Swanson ?07/14/2021 ? ?Referring provider: Wendie Agreste, MD ? ?Reason for consult/chief complaint: elevated liver functions  (Elevated liver functions) ? ? ?Subjective  ?HPI: ? ?Kyle Swanson was referred by primary care for elevated liver labs. ? ?Primary care office note from December 2022 indicates patient's LFTs had previously improved with weight loss.  He is on Lipitor for dyslipidemia as well, and his lipids had normalized on last check in December.  Ultrasound is below ? ?He feels well, denies abdominal pain, nausea, vomiting, altered bowel habits weight loss or rectal bleeding. ?He tells me that before COVID he lost about 30 pounds through diet and exercise.  Record review shows that he had a milder ALT predominant transaminitis up to 2019, and then normal LFTs in June and October 2020. ?He has a few drinks once a week with some friends, has never been heavier than that.  Denies tobacco or drug use. ? ?ROS: ? ?Review of Systems  ?Constitutional:  Negative for appetite change and unexpected weight change.  ?HENT:  Negative for mouth sores and voice change.   ?Eyes:  Negative for pain and redness.  ?Respiratory:  Negative for cough and shortness of breath.   ?Cardiovascular:  Negative for chest pain and palpitations.  ?Genitourinary:  Negative for dysuria and hematuria.  ?Musculoskeletal:  Negative for arthralgias and myalgias.  ?Skin:  Negative for pallor and rash.  ?Neurological:  Negative for weakness and headaches.  ?Hematological:  Negative for adenopathy.  ? ? ?Past Medical History: ?Past Medical History:  ?Diagnosis Date  ? Allergic rhinitis 04/28/2012  ? Allergy   ? Anxiety   ? Asthma   ? as a child - no meds or inhalers now  ? Closed fracture of right distal radius   ? ?I last saw Kyle Swanson for a screening colonoscopy August 2019, complete exam, excellent prep, diminutive ascending colon tubular adenoma. ? ?Past Surgical History: ?Past  Surgical History:  ?Procedure Laterality Date  ? GANGLION CYST EXCISION    ? left wrist 10 years ago  ? OPEN REDUCTION INTERNAL FIXATION (ORIF) DISTAL RADIAL FRACTURE Right 02/11/2019  ? Procedure: OPEN REDUCTION INTERNAL FIXATION (ORIF) DISTAL RADIAL FRACTURE;  Surgeon: Milly Jakob, MD;  Location: [place];  Service: Orthopedics;  Laterality: Right;  SURGERY REQUEST TIME: 90 MINUTES ?PRE OP REGIONAL  ? TONSILLECTOMY AND ADENOIDECTOMY    ? childhood  ? ? ? ?Family History: ?Family History  ?Problem Relation Age of Onset  ? COPD Father   ?     emphysema  ? Heart disease Father   ? Cancer Maternal Grandmother   ?     skin  ? Cancer Paternal Grandmother   ? Diabetes Paternal Grandfather   ? Colon cancer Neg Hx   ? Esophageal cancer Neg Hx   ? Rectal cancer Neg Hx   ? Stomach cancer Neg Hx   ? ? ?Social History: ?Social History  ? ?Socioeconomic History  ? Marital status: Divorced  ?  Spouse name: Not on file  ? Number of children: Not on file  ? Years of education: Not on file  ? Highest education level: Not on file  ?Occupational History  ? Occupation: Neurosurgeon  ?Tobacco Use  ? Smoking status: Never  ? Smokeless tobacco: Never  ?Vaping Use  ? Vaping Use: Never used  ?Substance and Sexual Activity  ? Alcohol use: Yes  ?  Alcohol/week: 4.0 - 5.0 standard drinks  ?  Types:  4 - 5 Cans of beer per week  ?  Comment: BEER  ? Drug use: No  ? Sexual activity: Yes  ?  Comment: number of sex partners in the last 63 months - 43  ?Other Topics Concern  ? Not on file  ?Social History Narrative  ? Divorced. Education: [employer]. Exercise: No.  ? ?Social Determinants of Health  ? ?Financial Resource Strain: Not on file  ?Food Insecurity: Not on file  ?Transportation Needs: Not on file  ?Physical Activity: Not on file  ?Stress: Not on file  ?Social Connections: Not on file  ? ? ?Allergies: ?Allergies  ?Allergen Reactions  ? Sulfa Antibiotics Rash  ? ? ?Outpatient Meds: ?Current Outpatient Medications   ?Medication Sig Dispense Refill  ? atorvastatin (LIPITOR) 10 MG tablet TAKE 1 TABLET BY MOUTH DAILY. CAN START FEW DAYS PER WEEK AND INCREASE AS TOLERATED 90 tablet 0  ? B Complex-C (SUPER B COMPLEX PO)     ? Cholecalciferol (VITAMIN D3) 10 MCG (400 UNIT) CAPS     ? doxycycline (MONODOX) 100 MG capsule Take 100 mg by mouth daily.    ? fluticasone (FLONASE) 50 MCG/ACT nasal spray Place into both nostrils daily.    ? loratadine (CLARITIN) 10 MG tablet Take 10 mg by mouth daily.    ? tamsulosin (FLOMAX) 0.4 MG CAPS capsule Take 1 capsule by mouth daily.    ? ?No current facility-administered medications for this visit.  ? ? ? ? ?___________________________________________________________________ ?Objective  ? ?Exam: ? ?BP 120/82   Pulse 82   Ht '6\' 2"'$  (1.88 m)   Wt 211 lb (95.7 kg)   BMI 27.09 kg/m?  ?Wt Readings from Last 3 Encounters:  ?07/14/21 211 lb (95.7 kg)  ?03/16/21 212 lb 12.8 oz (96.5 kg)  ?12/26/20 212 lb 9.6 oz (96.4 kg)  ? ? ?General: Well-appearing ?Eyes: sclera anicteric, no redness ?ENT: oral mucosa moist without lesions, no cervical or supraclavicular lymphadenopathy ?CV: RRR without murmur, S1/S2, no JVD, no peripheral edema ?Resp: clear to auscultation bilaterally, normal RR and effort noted ?GI: soft, no tenderness, with active bowel sounds. No guarding or palpable organomegaly noted.  Liver edge not felt on inspiration ?Skin; warm and dry, no rash or jaundice noted ?Neuro: awake, alert and oriented x 3. Normal gross motor function and fluent speech ? ?Labs: ? ? ?  Latest Ref Rng & Units 03/16/2021  ? 12:25 PM 12/08/2020  ?  8:04 AM 11/10/2020  ?  8:41 AM  ?CMP  ?Glucose 70 - 99 mg/dL 80    87    ?BUN 6 - 23 mg/dL 18    17    ?Creatinine 0.40 - 1.50 mg/dL 0.98    1.03    ?Sodium 135 - 145 mEq/L 139    140    ?Potassium 3.5 - 5.1 mEq/L 4.1    4.0    ?Chloride 96 - 112 mEq/L 104    106    ?CO2 19 - 32 mEq/L 27    24    ?Calcium 8.4 - 10.5 mg/dL 9.6    9.2    ?Total Protein 6.0 - 8.3 g/dL 7.2   6.7    6.9    ?Total Bilirubin 0.2 - 1.2 mg/dL 1.0   0.7   0.6    ?Alkaline Phos 39 - 117 U/L 69   65   71    ?AST 0 - 37 U/L 77   66   67    ?ALT 0 -  53 U/L 142   175   158    ? ? ?  Latest Ref Rng & Units 11/10/2020  ?  8:41 AM 01/02/2019  ?  9:56 AM 08/09/2016  ?  4:10 PM  ?CBC  ?WBC 4.0 - 10.5 K/uL 5.1   5.1   8.4    ?Hemoglobin 13.0 - 17.0 g/dL 15.4   15.6   16.5    ?Hematocrit 39.0 - 52.0 % 44.9   45.9   47.4    ?Platelets 150.0 - 400.0 K/uL 224.0      ? ?Negative acute viral hepatitis panel October 2022. ?Positive hepatitis B surface antibody in 2014 (vaccination) ? ?Radiologic Studies: ? ?CLINICAL DATA:  Elevated LFTs. ?  ?EXAM: ?ULTRASOUND ABDOMEN LIMITED RIGHT UPPER QUADRANT ?  ?COMPARISON:  None. ?  ?FINDINGS: ?Gallbladder: ?  ?Physiologically distended. There is a 3 mm gallbladder polyp. No ?gallstones or wall thickening visualized. No sonographic Murphy sign ?noted by sonographer. ?  ?Common bile duct: ?  ?Diameter: 4 mm, normal. ?  ?Liver: ?  ?Heterogeneous and diffusely increased in parenchymal echogenicity. ?No capsular nodularity. Multiple hypoechoic lesions throughout the ?liver, likely cyst largest measuring 2.1 x 1.4 x 1.9 cm which may ?represent a septated cyst or cluster of adjacent cysts. Probable ?subcapsular complex cyst measuring 1 cm in the left lobe. Portal ?vein is patent on color Doppler imaging with normal direction of ?blood flow towards the liver. ?  ?Other: No right upper quadrant ascites. ?  ?IMPRESSION: ?1. Heterogeneous and diffusely increased hepatic parenchymal ?echogenicity typical of steatosis. ?2. Multiple cysts throughout the liver, as well as subcentimeter ?lesions are too small to characterize, but favor cysts. ?3. Gallbladder polyp measuring 3 mm. No further follow-up is needed ?given size. No gallstones. ?  ?  ?Electronically Signed ?  By: Keith Rake M.D. ?  On: 01/08/2021 11:49 ? ? ?Assessment: ?Encounter Diagnoses  ?Name Primary?  ? LFTs abnormal Yes  ? Fatty liver  disease, nonalcoholic   ? ? ?ALT predominant transaminitis, more prominent now than several years ago.  History indicates that improved with weight loss, lending more evidence 2 is likely being related to metabolic fatty l

## 2021-07-19 LAB — ANA: Anti Nuclear Antibody (ANA): POSITIVE — AB

## 2021-07-19 LAB — ALPHA-1-ANTITRYPSIN: A-1 Antitrypsin, Ser: 127 mg/dL (ref 83–199)

## 2021-07-19 LAB — HEPATITIS A ANTIBODY, TOTAL: Hepatitis A AB,Total: REACTIVE — AB

## 2021-07-19 LAB — TISSUE TRANSGLUTAMINASE, IGA: (tTG) Ab, IgA: <1 U/mL

## 2021-07-19 LAB — ANTI-NUCLEAR AB-TITER (ANA TITER): ANA Titer 1: 1:80 {titer} — ABNORMAL HIGH

## 2021-07-19 LAB — MITOCHONDRIAL ANTIBODIES: Mitochondrial M2 Ab, IgG: <=20 U (ref <=20.0)

## 2021-07-19 LAB — ANTI-SMOOTH MUSCLE ANTIBODY, IGG: Actin (Smooth Muscle) Antibody (IGG): <20 U (ref <20)

## 2021-07-19 LAB — IGA: Immunoglobulin A: 225 mg/dL (ref 47–310)

## 2021-07-19 LAB — CERULOPLASMIN: Ceruloplasmin: 24 mg/dL (ref 18–36)

## 2021-07-20 ENCOUNTER — Encounter: Payer: Self-pay | Admitting: Gastroenterology

## 2021-09-04 ENCOUNTER — Other Ambulatory Visit: Payer: Self-pay | Admitting: Family Medicine

## 2021-09-04 DIAGNOSIS — E785 Hyperlipidemia, unspecified: Secondary | ICD-10-CM

## 2021-09-14 ENCOUNTER — Ambulatory Visit (INDEPENDENT_AMBULATORY_CARE_PROVIDER_SITE_OTHER): Payer: 59 | Admitting: Family Medicine

## 2021-09-14 VITALS — BP 126/64 | HR 88 | Temp 98.1°F | Resp 16 | Ht 74.0 in | Wt 210.0 lb

## 2021-09-14 DIAGNOSIS — R7989 Other specified abnormal findings of blood chemistry: Secondary | ICD-10-CM

## 2021-09-14 DIAGNOSIS — E785 Hyperlipidemia, unspecified: Secondary | ICD-10-CM

## 2021-09-14 LAB — LIPID PANEL
Cholesterol: 170 mg/dL (ref 0–200)
HDL: 72 mg/dL (ref >39.00)
LDL Cholesterol: 84 mg/dL (ref 0–99)
NonHDL: 98.33
Total CHOL/HDL Ratio: 2
Triglycerides: 73 mg/dL (ref 0.0–149.0)
VLDL: 14.6 mg/dL (ref 0.0–40.0)

## 2021-09-14 LAB — COMPREHENSIVE METABOLIC PANEL
ALT: 61 U/L — ABNORMAL HIGH (ref 0–53)
AST: 37 U/L (ref 0–37)
Albumin: 4.4 g/dL (ref 3.5–5.2)
Alkaline Phosphatase: 66 U/L (ref 39–117)
BUN: 16 mg/dL (ref 6–23)
CO2: 27 mEq/L (ref 19–32)
Calcium: 9.4 mg/dL (ref 8.4–10.5)
Chloride: 107 mEq/L (ref 96–112)
Creatinine, Ser: 0.94 mg/dL (ref 0.40–1.50)
GFR: 91.89 mL/min (ref >60.00)
Glucose, Bld: 93 mg/dL (ref 70–99)
Potassium: 4.3 mEq/L (ref 3.5–5.1)
Sodium: 140 mEq/L (ref 135–145)
Total Bilirubin: 0.7 mg/dL (ref 0.2–1.2)
Total Protein: 7.4 g/dL (ref 6.0–8.3)

## 2021-09-14 MED ORDER — ATORVASTATIN CALCIUM 10 MG PO TABS: ORAL_TABLET | ORAL | 1 refills | Status: DC

## 2021-09-14 NOTE — Progress Notes (Signed)
Subjective:  Patient ID: Kyle Swanson, male    DOB: 04-Mar-1966  Age: 56 y.o. MRN: [id number]  CC:  Chief Complaint  Patient presents with   Hyperlipidemia    HPI [name] presents for   Hyperlipidemia: Lipitor 10 mg daily. No new myalgias. Initially on CoQ10, not recently.  Fasting today.  Lab Results  Component Value Date   CHOL 161 03/16/2021   HDL 45.40 03/16/2021   LDLCALC 95 03/16/2021   TRIG 106.0 03/16/2021   CHOLHDL 4 03/16/2021   Lab Results  Component Value Date   ALT 142 (H) 03/16/2021   AST 77 (H) 03/16/2021   ALKPHOS 69 03/16/2021   BILITOT 1.0 03/16/2021   Elevated LFTs Ultrasound October 2022.  Heterogeneous diffusely increased hepatic parenchymal echogenicity typical of steatosis.  Multiple liver cysts and subcentimeter lesions but favor cyst.  Gallbladder polyp measuring 3 mm no further follow-up needed given size and no gallstones. Seen by GI, visit in April reviewed: ALT predominant transaminitis, prior improvement with weight loss, likely metabolic fatty liver.  Reduction of simple and complex carbohydrates.  Labs ordered, did not suggest autoimmune liver disease, disorder of iron or copper metabolism or celiac.  Mild elevated ferritin but other iron studies okay.  Thought to be secondary phenomenon to the fatty liver.  Mildly abnormal ANA, nonspecific.  However negative smooth muscle antibody.  73-monthfollow-up.  Repeat labs today. Colonoscopy repeat 2024(polyp, repeat 5 years) Some change in diet - less carbs. No regular exercise  - walking with hills in past.   Wt Readings from Last 3 Encounters:  09/14/21 210 lb (95.3 kg)  07/14/21 211 lb (95.7 kg)  03/16/21 212 lb 12.8 oz (96.5 kg)   Lab Results  Component Value Date   ALT 142 (H) 03/16/2021   AST 77 (H) 03/16/2021   ALKPHOS 69 03/16/2021   BILITOT 1.0 03/16/2021   [facility]0.78[facility] Dr. BGloriann Loan   Biological father passed in May.   Doing okay, denies needs at this time.  History Patient Active Problem List   Diagnosis Date Noted   General weakness 08/09/2016   Viral illness 08/09/2016   Palpitations 01/05/2013   Allergy    Asthma    Allergic rhinitis 04/28/2012   Routine health maintenance 09/06/2011   Past Medical History:  Diagnosis Date   Allergic rhinitis 04/28/2012   Allergy    Anxiety    Asthma    as a child - no meds or inhalers now   Closed fracture of right distal radius    Past Surgical History:  Procedure Laterality Date   GANGLION CYST EXCISION     left wrist 10 years ago   OPEN REDUCTION INTERNAL FIXATION (ORIF) DISTAL RADIAL FRACTURE Right 02/11/2019   Procedure: OPEN REDUCTION INTERNAL FIXATION (ORIF) DISTAL RADIAL FRACTURE;  Surgeon: TMilly Jakob MD;  [place] [facility]  Service: Orthopedics;  Laterality: Right;  SURGERY REQUEST TIME: 90 MINUTES PRE OP REGIONAL   TONSILLECTOMY AND ADENOIDECTOMY     childhood   Allergies  Allergen Reactions   Sulfa Antibiotics Rash   Prior to Admission medications   Medication Sig Start Date End Date Taking? Authorizing Provider  atorvastatin (LIPITOR) 10 MG tablet TAKE 1 TABLET BY MOUTH EVERY DAY. CAN START FEW DAYS PER WEEK AND INCREASE AS TOLERATED. 09/04/21  Yes GWendie Agreste MD  B Complex-C (SUPER B COMPLEX PO)  12/07/20  Yes [provider]  Cholecalciferol (VITAMIN D3)  10 MCG (400 UNIT) CAPS  12/07/20  Yes [provider]  doxycycline (MONODOX) 100 MG capsule Take 100 mg by mouth daily. 06/18/21  Yes [provider]  fluticasone (FLONASE) 50 MCG/ACT nasal spray Place into both nostrils daily.   Yes [provider]  loratadine (CLARITIN) 10 MG tablet Take 10 mg by mouth daily.   Yes [provider]  tamsulosin (FLOMAX) 0.4 MG CAPS capsule Take 1 capsule by mouth daily. 07/27/19  Yes [provider]   Social History   Socioeconomic History   Marital status: Divorced     Spouse name: Not on file   Number of children: Not on file   Years of education: Not on file   Highest education level: Not on file  Occupational History   Occupation: Ambulance person Fabrication  Tobacco Use   Smoking status: Never   Smokeless tobacco: Never  Vaping Use   Vaping Use: Never used  Substance and Sexual Activity   Alcohol use: Yes    Alcohol/week: 4.0 - 5.0 standard drinks of alcohol    Types: 4 - 5 Cans of beer per week    Comment: BEER   Drug use: No   Sexual activity: Yes    Comment: number of sex partners in the last 12 months - 4  Other Topics Concern   Not on file  Social History Narrative   Divorced. Education: [employer]. Exercise: No.   Social Determinants of Health   Financial Resource Strain: Not on file  Food Insecurity: Not on file  Transportation Needs: Not on file  Physical Activity: Not on file  Stress: Not on file  Social Connections: Not on file  Intimate Partner Violence: Not on file    Review of Systems  Constitutional:  Negative for fatigue and unexpected weight change.  Eyes:  Negative for visual disturbance.  Respiratory:  Negative for cough, chest tightness and shortness of breath.   Cardiovascular:  Negative for chest pain, palpitations and leg swelling.  Gastrointestinal:  Negative for abdominal pain, blood in stool and vomiting.  Skin:  Negative for color change.  Neurological:  Negative for dizziness, light-headedness and headaches.  All other systems reviewed and are negative.    Objective:   Vitals:   09/14/21 1012  BP: 126/64  Pulse: 88  Resp: 16  Temp: 98.1 F (36.7 C)  TempSrc: Temporal  SpO2: 95%  Weight: 210 lb (95.3 kg)  Height: '6\' 2"'$  (1.88 m)    Physical Exam Vitals reviewed.  Constitutional:      Appearance: He is well-developed.  HENT:     Head: Normocephalic and atraumatic.  Neck:     Vascular: No carotid bruit or JVD.  Cardiovascular:     Rate and Rhythm: Normal rate and regular rhythm.     Heart sounds:  Normal heart sounds. No murmur heard. Pulmonary:     Effort: Pulmonary effort is normal.     Breath sounds: Normal breath sounds. No rales.  Abdominal:     General: Abdomen is flat. There is no distension.     Tenderness: There is no abdominal tenderness.  Musculoskeletal:     Right lower leg: No edema.     Left lower leg: No edema.  Skin:    General: Skin is warm and dry.  Neurological:     Mental Status: He is alert and oriented to person, place, and time.  Psychiatric:        Mood and Affect: Mood normal.  Assessment & Plan:  Destine Ambroise is a 56 y.o. male . Hyperlipidemia, unspecified hyperlipidemia type - Plan: atorvastatin (LIPITOR) 10 MG tablet, Comprehensive metabolic panel, Lipid panel  -Continue Lipitor, check labs.  Tolerating current dose.  Monitor LFTs for significant change but likely fatty liver as below.  Elevated LFTs - Plan: Comprehensive metabolic panel  -Work-up with gastroenterology as above, appears to be fatty liver, commended on some dietary changes, decreased carbohydrates, but add in exercise, plans for walking initially.  Goals discussed.  Recheck in August for physical.     Meds ordered this encounter  Medications   atorvastatin (LIPITOR) 10 MG tablet    Sig: TAKE 1 TABLET BY MOUTH EVERY DAY.    Dispense:  90 tablet    Refill:  1   Patient Instructions  Dr. Loletha Carrow wanted to see you in 6 months - schedule appointment with him in October. I will recheck some labs today. No change in lipitor for now.  Sorry to hear about your father.  Let me know if you are having any difficulty.  Hang in there.  I will see you in August but let me know if there are questions sooner.  Take care.       Signed,   [name], MD [place] 09/14/21 11:24 AM

## 2021-09-14 NOTE — Patient Instructions (Addendum)
Dr. Loletha Carrow wanted to see you in 6 months - schedule appointment with him in October. I will recheck some labs today. No change in lipitor for now.  Sorry to hear about your father.  Let me know if you are having any difficulty.  Hang in there.  I will see you in August but let me know if there are questions sooner.  Take care.

## 2021-11-23 ENCOUNTER — Ambulatory Visit (INDEPENDENT_AMBULATORY_CARE_PROVIDER_SITE_OTHER): Payer: 59 | Admitting: Family Medicine

## 2021-11-23 ENCOUNTER — Encounter: Payer: Self-pay | Admitting: Family Medicine

## 2021-11-23 VITALS — BP 122/74 | HR 81 | Temp 98.1°F | Resp 16 | Ht 74.0 in | Wt 212.8 lb

## 2021-11-23 DIAGNOSIS — Z0000 Encounter for general adult medical examination without abnormal findings: Secondary | ICD-10-CM | POA: Diagnosis not present

## 2021-11-23 DIAGNOSIS — Z23 Encounter for immunization: Secondary | ICD-10-CM

## 2021-11-23 DIAGNOSIS — E785 Hyperlipidemia, unspecified: Secondary | ICD-10-CM

## 2021-11-23 MED ORDER — ATORVASTATIN CALCIUM 10 MG PO TABS: ORAL_TABLET | ORAL | 1 refills | Status: DC

## 2021-11-23 NOTE — Patient Instructions (Addendum)
Goal of 31mn exercise 5 days per week.  No med changes today.  Take care.   Preventive Care Kyle Swanson, Male Preventive care refers to lifestyle choices and visits with your health care provider that can promote health and wellness. Preventive care visits are also called wellness exams. What can I expect for my preventive care visit? Counseling During your preventive care visit, your health care provider may ask about your: Medical history, including: Past medical problems. Family medical history. Current health, including: Emotional well-being. Home life and relationship well-being. Sexual activity. Lifestyle, including: Alcohol, nicotine or tobacco, and drug use. Access to firearms. Diet, exercise, and sleep habits. Safety issues such as seatbelt and bike helmet use. Sunscreen use. Work and work eStatistician Physical exam Your health care provider will check your: Height and weight. These may be used to calculate your BMI (body mass index). BMI is a measurement that tells if you are at a healthy weight. Waist circumference. This measures the distance around your waistline. This measurement also tells if you are at a healthy weight and may help predict your risk of certain diseases, such as type 2 diabetes and high blood pressure. Heart rate and blood pressure. Body temperature. Skin for abnormal spots. What immunizations do I need?  Vaccines are usually given at various ages, according to a schedule. Your health care provider will recommend vaccines for you based on your age, medical history, and lifestyle or other factors, such as travel or where you work. What tests do I need? Screening Your health care provider may recommend screening tests for certain conditions. This may include: Lipid and cholesterol levels. Diabetes screening. This is done by checking your blood sugar (glucose) after you have not eaten for a while (fasting). Hepatitis B test. Hepatitis C  test. HIV (human immunodeficiency virus) test. STI (sexually transmitted infection) testing, if you are at risk. Lung cancer screening. Prostate cancer screening. Colorectal cancer screening. Talk with your health care provider about your test results, treatment options, and if necessary, the need for more tests. Follow these instructions at home: Eating and drinking  Eat a diet that includes fresh fruits and vegetables, whole grains, lean protein, and low-fat dairy products. Take vitamin and mineral supplements as recommended by your health care provider. Do not drink alcohol if your health care provider tells you not to drink. If you drink alcohol: Limit how much you have to 0-2 drinks a day. Know how much alcohol is in your drink. In the [place] one drink equals one 12 oz bottle of beer (355 mL), one 5 oz glass of wine (148 mL), or one 1 oz glass of hard liquor (44 mL). Lifestyle Brush your teeth every morning and night with fluoride toothpaste. Floss one time each day. Exercise for at least 30 minutes 5 or more days each week. Do not use any products that contain nicotine or tobacco. These products include cigarettes, chewing tobacco, and vaping devices, such as e-cigarettes. If you need help quitting, ask your health care provider. Do not use drugs. If you are sexually active, practice safe sex. Use a condom or other form of protection to prevent STIs. Take aspirin only as told by your health care provider. Make sure that you understand how much to take and what form to take. Work with your health care provider to find out whether it is safe and beneficial for you to take aspirin daily. Find healthy ways to manage stress, such as: Meditation, yoga, or listening to music. Journaling.  Talking to a trusted person. Spending time with friends and family. Minimize exposure to UV radiation to reduce your risk of skin cancer. Safety Always wear your seat belt while driving or riding in a  vehicle. Do not drive: If you have been drinking alcohol. Do not ride with someone who has been drinking. When you are tired or distracted. While texting. If you have been using any mind-altering substances or drugs. Wear a helmet and other protective equipment during sports activities. If you have firearms in your house, make sure you follow all gun safety procedures. What's next? Go to your health care provider once a year for an annual wellness visit. Ask your health care provider how often you should have your eyes and teeth checked. Stay up to date on all vaccines. This information is not intended to replace advice given to you by your health care provider. Make sure you discuss any questions you have with your health care provider. Document Revised: 09/07/2020 Document Reviewed: 09/07/2020 Elsevier Patient Education  [place].

## 2021-11-23 NOTE — Progress Notes (Signed)
Subjective:  Patient ID: Kyle Swanson, male    DOB: 05-06-1965  Age: 56 y.o. MRN: [id number]  CC:  Chief Complaint  Patient presents with   Annual Exam    Pt is fasting today     HPI Kyle Swanson presents for Annual Exam PCP, me Urology, Dr. Gloriann Loan Gastroenterology, Dr. Loletha Carrow Derm: Dr. Ronnald Ramp.   Hyperlipidemia: Lipitor 10 mg daily.  Elevated LFT noted previously with heterogeneous diffusely increased hepatic parenchymal echogenicity typical steatosis on ultrasound 2022, multiple liver cysts, gallbladder polyp.  Seen by GI.  Likely metabolic fatty liver.  Appointment with GI October 31. No new side effects/myalgias.  Lab Results  Component Value Date   CHOL 170 09/14/2021   HDL 72.00 09/14/2021   LDLCALC 84 09/14/2021   TRIG 73.0 09/14/2021   CHOLHDL 2 09/14/2021   Lab Results  Component Value Date   ALT 61 (H) 09/14/2021   AST 37 09/14/2021   ALKPHOS 66 09/14/2021   BILITOT 0.7 09/14/2021   Wt Readings from Last 3 Encounters:  11/23/21 212 lb 12.8 oz (96.5 kg)  09/14/21 210 lb (95.3 kg)  07/14/21 211 lb (95.7 kg)   Allergic rhinitis: Treated with flonase and claritin.      11/23/2021    8:06 AM 09/14/2021   10:14 AM 03/16/2021   11:50 AM 12/26/2020    3:08 PM 11/10/2020    8:00 AM  Depression screen PHQ 2/9  Decreased Interest 0 0 0 0 0  Down, Depressed, Hopeless 0 0 0 0 0  PHQ - 2 Score 0 0 0 0 0  Altered sleeping  '1 1 1 1  '$ Tired, decreased energy  '1 1 1 1  '$ Change in appetite  0 0 0 0  Feeling bad or failure about yourself   0 0 0 0  Trouble concentrating  0 0 0 0  Moving slowly or fidgety/restless  0 0 0 0  Suicidal thoughts  0 0 0 0  PHQ-9 Score  '2 2 2 2  '$ Difficult doing work/chores   Not difficult at all      Health Maintenance  Topic Date Due   INFLUENZA VACCINE  10/24/2021   COVID-19 Vaccine (5 - Moderna series) 12/09/2021 (Originally 03/06/2021)   COLONOSCOPY (Pts 45-37yr Insurance coverage will need to be confirmed)  11/01/2022    TETANUS/TDAP  06/22/2024   Hepatitis C Screening  Completed   HIV Screening  Completed   Zoster Vaccines- Shingrix  Completed   HPV VACCINES  Aged Out  Colonoscopy 10/31/2017 repeat 5 years, single polyp. Prostate: Followed by urology with normal PSAs.Last visit reviewed from June 8.  BPH, treated with Flomax 0.4 mg.  1 year follow-up for PSA , 0.81 07/25/20.  Reports 0.78 on most recent PSA Lab Results  Component Value Date   PSA1 0.7 01/02/2019   PSA1 0.8 08/26/2018   PSA1 1.1 06/27/2017   PSA 0.83 01/31/2015   PSA 1.16 10/06/2012   PSA 1.11 04/28/2012  Derm - DWilhemina Bonito appt in past year.    Immunization History  Administered Date(s) Administered   Influenza, Seasonal, Injecte, Preservative Fre 04/28/2012   Influenza,inj,Quad PF,6+ Mos 01/04/2017, 12/07/2017, 12/16/2018   Influenza,inj,quad, With Preservative 02/18/2016   Influenza-Unspecified 12/25/2014, 01/19/2016, 01/04/2017, 12/07/2017, 12/02/2020   Moderna Sars-Covid-2 Vaccination 06/04/2019, 07/02/2019, 02/22/2020, 01/09/2021   Td 06/23/2014   Tdap 04/28/2012   Zoster Recombinat (Shingrix) 10/11/2017, 12/07/2017  Covid vaccine: initial series, booster x 2. Had bivalent in 12/2020.  Flu vaccine today.   No  results found. Optho - appt in April - wears glasses.   Dental: every 6 months.   Alcohol: 4-6 beers per week.   Tobacco: none.   Exercise: up an down. Intermittent. 12k steps at work.    History Patient Active Problem List   Diagnosis Date Noted   General weakness 08/09/2016   Viral illness 08/09/2016   Palpitations 01/05/2013   Allergy    Asthma    Allergic rhinitis 04/28/2012   Routine health maintenance 09/06/2011   Past Medical History:  Diagnosis Date   Allergic rhinitis 04/28/2012   Allergy    Anxiety    Asthma    as a child - no meds or inhalers now   Closed fracture of right distal radius    Past Surgical History:  Procedure Laterality Date   GANGLION CYST EXCISION     left wrist 10 years  ago   OPEN REDUCTION INTERNAL FIXATION (ORIF) DISTAL RADIAL FRACTURE Right 02/11/2019   Procedure: OPEN REDUCTION INTERNAL FIXATION (ORIF) DISTAL RADIAL FRACTURE;  Surgeon: Milly Jakob, MD;  Location: [place];  Service: Orthopedics;  Laterality: Right;  SURGERY REQUEST TIME: 90 MINUTES PRE OP REGIONAL   TONSILLECTOMY AND ADENOIDECTOMY     childhood   Allergies  Allergen Reactions   Sulfa Antibiotics Rash   Prior to Admission medications   Medication Sig Start Date End Date Taking? Authorizing Provider  atorvastatin (LIPITOR) 10 MG tablet TAKE 1 TABLET BY MOUTH EVERY DAY. 09/14/21  Yes Wendie Agreste, MD  B Complex-C (SUPER B COMPLEX PO)  12/07/20  Yes [provider]  Cholecalciferol (VITAMIN D3) 10 MCG (400 UNIT) CAPS  12/07/20  Yes [provider]  doxycycline (MONODOX) 100 MG capsule Take 100 mg by mouth daily. 06/18/21  Yes [provider]  fluticasone (FLONASE) 50 MCG/ACT nasal spray Place into both nostrils daily.   Yes [provider]  loratadine (CLARITIN) 10 MG tablet Take 10 mg by mouth daily.   Yes [provider]  tamsulosin (FLOMAX) 0.4 MG CAPS capsule Take 1 capsule by mouth daily. 07/27/19  Yes [provider]   Social History   Socioeconomic History   Marital status: Divorced    Spouse name: Not on file   Number of children: Not on file   Years of education: Not on file   Highest education level: Not on file  Occupational History   Occupation: Ambulance person Fabrication  Tobacco Use   Smoking status: Never   Smokeless tobacco: Never  Vaping Use   Vaping Use: Never used  Substance and Sexual Activity   Alcohol use: Yes    Alcohol/week: 4.0 - 5.0 standard drinks of alcohol    Types: 4 - 5 Cans of beer per week    Comment: BEER   Drug use: No   Sexual activity: Yes    Comment: number of sex partners in the last 12 months - 4  Other Topics Concern   Not on file  Social History Narrative    Divorced. Education: [employer]. Exercise: No.   Social Determinants of Health   Financial Resource Strain: Not on file  Food Insecurity: Not on file  Transportation Needs: Not on file  Physical Activity: Not on file  Stress: Not on file  Social Connections: Not on file  Intimate Partner Violence: Not on file    Review of Systems 13 point review of systems per patient health survey noted.  Negative other than as indicated above or in HPI.  Objective:   Vitals:   11/23/21 0803  BP: 122/74  Pulse: 81  Resp: 16  Temp: 98.1 F (36.7 C)  TempSrc: Oral  SpO2: 92%  Weight: 212 lb 12.8 oz (96.5 kg)  Height: '6\' 2"'$  (1.88 m)     Physical Exam Vitals reviewed.  Constitutional:      Appearance: He is well-developed.  HENT:     Head: Normocephalic and atraumatic.     Right Ear: External ear normal.     Left Ear: External ear normal.  Eyes:     Conjunctiva/sclera: Conjunctivae normal.     Pupils: Pupils are equal, round, and reactive to light.  Neck:     Thyroid: No thyromegaly.  Cardiovascular:     Rate and Rhythm: Normal rate and regular rhythm.     Heart sounds: Normal heart sounds.  Pulmonary:     Effort: Pulmonary effort is normal. No respiratory distress.     Breath sounds: Normal breath sounds. No wheezing.  Abdominal:     General: There is no distension.     Palpations: Abdomen is soft.     Tenderness: There is no abdominal tenderness.  Musculoskeletal:        General: No tenderness. Normal range of motion.     Cervical back: Normal range of motion and neck supple.  Lymphadenopathy:     Cervical: No cervical adenopathy.  Skin:    General: Skin is warm and dry.  Neurological:     Mental Status: He is alert and oriented to person, place, and time.     Deep Tendon Reflexes: Reflexes are normal and symmetric.  Psychiatric:        Behavior: Behavior normal.     Assessment & Plan:  Hanan Moen is a 56 y.o. male . Annual physical exam  - -anticipatory  guidance as below in AVS, screening labs above. Health maintenance items as above in HPI discussed/recommended as applicable.   - increased exercise discussed.  Hyperlipidemia, unspecified hyperlipidemia type - Plan: atorvastatin (LIPITOR) 10 MG tablet  -  Stable, tolerating current regimen. Medications refilled.   Need for influenza vaccination - Plan: Flu Vaccine QUAD 6+ mos PF IM (Fluarix Quad PF)   Meds ordered this encounter  Medications   atorvastatin (LIPITOR) 10 MG tablet    Sig: TAKE 1 TABLET BY MOUTH EVERY DAY.    Dispense:  90 tablet    Refill:  1   Patient Instructions  Goal of 14mn exercise 5 days per week.  No med changes today.  Take care.   Preventive Care [id number] Old, Male Preventive care refers to lifestyle choices and visits with your health care provider that can promote health and wellness. Preventive care visits are also called wellness exams. What can I expect for my preventive care visit? Counseling During your preventive care visit, your health care provider may ask about your: Medical history, including: Past medical problems. Family medical history. Current health, including: Emotional well-being. Home life and relationship well-being. Sexual activity. Lifestyle, including: Alcohol, nicotine or tobacco, and drug use. Access to firearms. Diet, exercise, and sleep habits. Safety issues such as seatbelt and bike helmet use. Sunscreen use. Work and work eStatistician Physical exam Your health care provider will check your: Height and weight. These may be used to calculate your BMI (body mass index). BMI is a measurement that tells if you are at a healthy weight. Waist circumference. This measures the distance around your waistline. This measurement also tells if you are at  a healthy weight and may help predict your risk of certain diseases, such as type 2 diabetes and high blood pressure. Heart rate and blood pressure. Body temperature. Skin for  abnormal spots. What immunizations do I need?  Vaccines are usually given at various ages, according to a schedule. Your health care provider will recommend vaccines for you based on your age, medical history, and lifestyle or other factors, such as travel or where you work. What tests do I need? Screening Your health care provider may recommend screening tests for certain conditions. This may include: Lipid and cholesterol levels. Diabetes screening. This is done by checking your blood sugar (glucose) after you have not eaten for a while (fasting). Hepatitis B test. Hepatitis C test. HIV (human immunodeficiency virus) test. STI (sexually transmitted infection) testing, if you are at risk. Lung cancer screening. Prostate cancer screening. Colorectal cancer screening. Talk with your health care provider about your test results, treatment options, and if necessary, the need for more tests. Follow these instructions at home: Eating and drinking  Eat a diet that includes fresh fruits and vegetables, whole grains, lean protein, and low-fat dairy products. Take vitamin and mineral supplements as recommended by your health care provider. Do not drink alcohol if your health care provider tells you not to drink. If you drink alcohol: Limit how much you have to 0-2 drinks a day. Know how much alcohol is in your drink. In the [place] one drink equals one 12 oz bottle of beer (355 mL), one 5 oz glass of wine (148 mL), or one 1 oz glass of hard liquor (44 mL). Lifestyle Brush your teeth every morning and night with fluoride toothpaste. Floss one time each day. Exercise for at least 30 minutes 5 or more days each week. Do not use any products that contain nicotine or tobacco. These products include cigarettes, chewing tobacco, and vaping devices, such as e-cigarettes. If you need help quitting, ask your health care provider. Do not use drugs. If you are sexually active, practice safe sex. Use a  condom or other form of protection to prevent STIs. Take aspirin only as told by your health care provider. Make sure that you understand how much to take and what form to take. Work with your health care provider to find out whether it is safe and beneficial for you to take aspirin daily. Find healthy ways to manage stress, such as: Meditation, yoga, or listening to music. Journaling. Talking to a trusted person. Spending time with friends and family. Minimize exposure to UV radiation to reduce your risk of skin cancer. Safety Always wear your seat belt while driving or riding in a vehicle. Do not drive: If you have been drinking alcohol. Do not ride with someone who has been drinking. When you are tired or distracted. While texting. If you have been using any mind-altering substances or drugs. Wear a helmet and other protective equipment during sports activities. If you have firearms in your house, make sure you follow all gun safety procedures. What's next? Go to your health care provider once a year for an annual wellness visit. Ask your health care provider how often you should have your eyes and teeth checked. Stay up to date on all vaccines. This information is not intended to replace advice given to you by your health care provider. Make sure you discuss any questions you have with your health care provider. Document Revised: 09/07/2020 Document Reviewed: 09/07/2020 Elsevier Patient Education  [place].  Signed,   [name], MD [place], [facility] 11/23/21 8:37 AM

## 2022-01-23 ENCOUNTER — Ambulatory Visit (INDEPENDENT_AMBULATORY_CARE_PROVIDER_SITE_OTHER): Payer: 59 | Admitting: Gastroenterology

## 2022-01-23 ENCOUNTER — Encounter: Payer: Self-pay | Admitting: Gastroenterology

## 2022-01-23 VITALS — BP 120/78 | HR 81 | Ht 73.0 in | Wt 214.0 lb

## 2022-01-23 DIAGNOSIS — R7989 Other specified abnormal findings of blood chemistry: Secondary | ICD-10-CM

## 2022-01-23 DIAGNOSIS — K760 Fatty (change of) liver, not elsewhere classified: Secondary | ICD-10-CM

## 2022-01-23 NOTE — Patient Instructions (Signed)
_______________________________________________________  If you are age 55 or older, your body mass index should be between 23-30. Your Body mass index is 28.23 kg/m. If this is out of the aforementioned range listed, please consider follow up with your Primary Care Provider.  If you are age 20 or younger, your body mass index should be between 19-25. Your Body mass index is 28.23 kg/m. If this is out of the aformentioned range listed, please consider follow up with your Primary Care Provider.   ________________________________________________________  The [facility] providers would like to encourage you to use [facility] to communicate with providers for non-urgent requests or questions.  Due to long hold times on the telephone, sending your provider a message by [facility] may be a faster and more efficient way to get a response.  Please allow 48 business hours for a response.  Please remember that this is for non-urgent requests.  _______________________________________________________  It was a pleasure to see you today!  Thank you for trusting me with your gastrointestinal care!

## 2022-01-23 NOTE — Progress Notes (Signed)
GI Progress Note  Chief Complaint: Elevated LFTs (transaminitis)  Subjective  History: Kyle Swanson was last seen in office consult April of this year for ALT predominant transaminitis and imaging suggesting fatty liver in a patient with risk factors.  Additional metabolic and autoimmune liver disease work-up done after that visit.  He previously demonstrated ability to lose weight and control lipids through diet and lifestyle changes, and he planned to make further efforts in that regard. He does not drink alcohol heavily, and was counseled regarding moderation of the setting of fatty liver.  Kyle Swanson has been doing well since I last saw him.  Alcohol in moderation, still working on cutting carbs, keeping his weight under control.  Lipids are good, believes he has an appointment with Dr. Nyoka Cowden in about February to get that checked again. No new symptoms since last visit. ROS: Cardiovascular:  no chest pain Respiratory: no dyspnea  The patient's Past Medical, Family and Social History were reviewed and are on file in the EMR.  Objective:  Med list reviewed  Current Outpatient Medications:    atorvastatin (LIPITOR) 10 MG tablet, TAKE 1 TABLET BY MOUTH EVERY DAY., Disp: 90 tablet, Rfl: 1   B Complex-C (SUPER B COMPLEX PO), , Disp: , Rfl:    Cholecalciferol (VITAMIN D3) 10 MCG (400 UNIT) CAPS, , Disp: , Rfl:    doxycycline (MONODOX) 100 MG capsule, Take 100 mg by mouth daily., Disp: , Rfl:    fluticasone (FLONASE) 50 MCG/ACT nasal spray, Place into both nostrils daily., Disp: , Rfl:    loratadine (CLARITIN) 10 MG tablet, Take 10 mg by mouth daily., Disp: , Rfl:    tamsulosin (FLOMAX) 0.4 MG CAPS capsule, Take 1 capsule by mouth daily., Disp: , Rfl:   Social history: Works in [employer], has been there 25 years  Vital signs in last 24 hrs: Vitals:   01/23/22 1348  BP: 120/78  Pulse: 81   Wt Readings from Last 3 Encounters:  01/23/22 214 lb (97.1 kg)   11/23/21 212 lb 12.8 oz (96.5 kg)  09/14/21 210 lb (95.3 kg)    Physical Exam  Well-appearing HEENT: sclera anicteric, oral mucosa moist without lesions Cardiac: Regular murmur,  no peripheral edema Pulm: clear to auscultation bilaterally, normal RR and effort noted Abdomen: soft, no tenderness, with active bowel sounds. No guarding or palpable hepatosplenomegaly. Skin; warm and dry, no jaundice or rash  Labs:  Iron/TIBC/Ferritin/ %Sat    Component Value Date/Time   IRON 102 07/14/2021 0931   TIBC 331.8 07/14/2021 0931   FERRITIN 715.9 (H) 07/14/2021 0931   IRONPCTSAT 30.7 07/14/2021 0931   Negative ceruloplasmin, AMA, TTG, ASMA and alpha-1 antitrypsin. ANA mildly positive at 1:80 titer Positive for total hepatitis A antibody (therefore does not need vaccination)     Latest Ref Rng & Units 09/14/2021   11:11 AM 03/16/2021   12:25 PM 12/08/2020    8:04 AM  CMP  Glucose 70 - 99 mg/dL 93  80    BUN 6 - 23 mg/dL 16  18    Creatinine 0.40 - 1.50 mg/dL 0.94  0.98    Sodium 135 - 145 mEq/L 140  139    Potassium 3.5 - 5.1 mEq/L 4.3  4.1    Chloride 96 - 112 mEq/L 107  104    CO2 19 - 32 mEq/L 27  27    Calcium 8.4 - 10.5 mg/dL 9.4  9.6    Total Protein 6.0 - 8.3 g/dL  7.4  7.2  6.7   Total Bilirubin 0.2 - 1.2 mg/dL 0.7  1.0  0.7   Alkaline Phos 39 - 117 U/L 66  69  65   AST 0 - 37 U/L 37  77  66   ALT 0 - 53 U/L 61  142  175    T chol and TG nml ___________________________________________ Radiologic studies:   ____________________________________________ Other:   _____________________________________________ Assessment & Plan  Assessment: Encounter Diagnoses  Name Primary?   NAFLD (nonalcoholic fatty liver disease) Yes   LFTs abnormal    Stable fatty liver, LFTs much improved on most recent check 4 months ago.  Elevated ferritin consistent with secondary iron overload rather than hemochromatosis with a 31% saturation (conveyed to patient in result note portal  message)   Plan: Make best efforts at carbohydrate reduction, lipid control and weight management.  Check LFTs when primary care next 2 to check them for physical or when cholesterol checked.  They can let me know if there is a significant change.  Otherwise see me as needed.   Nelida Meuse III

## 2022-05-24 ENCOUNTER — Encounter: Payer: Self-pay | Admitting: Family Medicine

## 2022-05-24 ENCOUNTER — Ambulatory Visit (INDEPENDENT_AMBULATORY_CARE_PROVIDER_SITE_OTHER): Payer: 59 | Admitting: Family Medicine

## 2022-05-24 VITALS — BP 114/60 | HR 73 | Temp 97.7°F | Ht 73.0 in | Wt 205.4 lb

## 2022-05-24 DIAGNOSIS — R194 Change in bowel habit: Secondary | ICD-10-CM | POA: Diagnosis not present

## 2022-05-24 DIAGNOSIS — E785 Hyperlipidemia, unspecified: Secondary | ICD-10-CM | POA: Diagnosis not present

## 2022-05-24 DIAGNOSIS — R7989 Other specified abnormal findings of blood chemistry: Secondary | ICD-10-CM

## 2022-05-24 DIAGNOSIS — K760 Fatty (change of) liver, not elsewhere classified: Secondary | ICD-10-CM

## 2022-05-24 LAB — LIPID PANEL
Cholesterol: 153 mg/dL (ref 0–200)
HDL: 48.9 mg/dL (ref >39.00)
LDL Cholesterol: 89 mg/dL (ref 0–99)
NonHDL: 104.55
Total CHOL/HDL Ratio: 3
Triglycerides: 79 mg/dL (ref 0.0–149.0)
VLDL: 15.8 mg/dL (ref 0.0–40.0)

## 2022-05-24 LAB — COMPREHENSIVE METABOLIC PANEL
ALT: 50 U/L (ref 0–53)
AST: 58 U/L — ABNORMAL HIGH (ref 0–37)
Albumin: 4.3 g/dL (ref 3.5–5.2)
Alkaline Phosphatase: 58 U/L (ref 39–117)
BUN: 20 mg/dL (ref 6–23)
CO2: 29 mEq/L (ref 19–32)
Calcium: 9.8 mg/dL (ref 8.4–10.5)
Chloride: 102 mEq/L (ref 96–112)
Creatinine, Ser: 0.98 mg/dL (ref 0.40–1.50)
GFR: 86.99 mL/min (ref >60.00)
Glucose, Bld: 85 mg/dL (ref 70–99)
Potassium: 4.1 mEq/L (ref 3.5–5.1)
Sodium: 139 mEq/L (ref 135–145)
Total Bilirubin: 0.8 mg/dL (ref 0.2–1.2)
Total Protein: 7 g/dL (ref 6.0–8.3)

## 2022-05-24 MED ORDER — ATORVASTATIN CALCIUM 10 MG PO TABS: ORAL_TABLET | ORAL | 1 refills | Status: DC

## 2022-05-24 NOTE — Assessment & Plan Note (Signed)
Tolerating Lipitor, continue same, updated labs ordered.  Commended on positive health changes

## 2022-05-24 NOTE — Progress Notes (Signed)
Subjective:  Patient ID: Kyle Swanson, male    DOB: 1966-02-07  Age: 57 y.o. MRN: [id number]  CC:  Chief Complaint  Patient presents with   Hyperlipidemia    Pt doing well,    Results    Pt notes liver test repeat today    Medication Management    Pt would like to discuss supplements     HPI Kyle Swanson presents for   Hyperlipidemia: Lipitor '10mg'$  qd. No new myalgias.  Lab Results  Component Value Date   CHOL 170 09/14/2021   HDL 72.00 09/14/2021   LDLCALC 84 09/14/2021   TRIG 73.0 09/14/2021   CHOLHDL 2 09/14/2021   Lab Results  Component Value Date   ALT 61 (H) 09/14/2021   AST 37 09/14/2021   ALKPHOS 66 09/14/2021   BILITOT 0.7 09/14/2021   History of elevated LFTs with nonalcoholic fatty liver disease Seen by gastroenterology in October.  Stable fatty liver, LFTs were improved on recent check at that time.  Elevated ferritin consistent with secondary iron overload rather than hemochromatosis with 31% saturation ongoing monitoring with me for LFTs.  Follow-up with GI if significant change.  AST had improved from 77-37 last year, ALT 142-61 Weight has improved, down 7 pounds from last physical.  Improved diet - more protein, less carb and minimal alcohol and member of [employer] - exercise few days per week up to 5 times per week.  Supplement question - some trouble with stools at times. Gas without BM, no melena/hematochezia or abd pain.  Due for colonoscopy this year.  Wt Readings from Last 3 Encounters:  05/24/22 205 lb 6.4 oz (93.2 kg)  01/23/22 214 lb (97.1 kg)  11/23/21 212 lb 12.8 oz (96.5 kg)    Lab Results  Component Value Date   ALT 61 (H) 09/14/2021   AST 37 09/14/2021   ALKPHOS 66 09/14/2021   BILITOT 0.7 09/14/2021     History Patient Active Problem List   Diagnosis Date Noted   General weakness 08/09/2016   Viral illness 08/09/2016   Palpitations 01/05/2013   Allergy    Asthma    Allergic rhinitis 04/28/2012   Routine  health maintenance 09/06/2011    Past Medical History:  Diagnosis Date   Allergic rhinitis 04/28/2012   Allergy    Anxiety    Asthma    as a child - no meds or inhalers now   Closed fracture of right distal radius       Review of Systems Per HPI.   Objective:   Vitals:   05/24/22 0804  BP: 114/60  Pulse: 73  Temp: 97.7 F (36.5 C)  TempSrc: Temporal  SpO2: 96%  Weight: 205 lb 6.4 oz (93.2 kg)  Height: '6\' 1"'$  (1.854 m)     Physical Exam Vitals reviewed.  Constitutional:      Appearance: He is well-developed.  HENT:     Head: Normocephalic and atraumatic.  Neck:     Vascular: No carotid bruit or JVD.  Cardiovascular:     Rate and Rhythm: Normal rate and regular rhythm.     Heart sounds: Normal heart sounds. No murmur heard. Pulmonary:     Effort: Pulmonary effort is normal.     Breath sounds: Normal breath sounds. No rales.  Abdominal:     General: Abdomen is flat. Bowel sounds are normal. There is no distension.     Tenderness: There is no abdominal tenderness. There is no guarding.  Musculoskeletal:  Right lower leg: No edema.     Left lower leg: No edema.  Skin:    General: Skin is warm and dry.  Neurological:     Mental Status: He is alert and oriented to person, place, and time.  Psychiatric:        Mood and Affect: Mood normal.        Assessment & Plan:  Kyle Swanson is a 57 y.o. male . Hyperlipidemia, unspecified hyperlipidemia type Assessment & Plan: Tolerating Lipitor, continue same, updated labs ordered.  Commended on positive health changes  Orders: -     Atorvastatin Calcium; TAKE 1 TABLET BY MOUTH EVERY DAY.  Dispense: 90 tablet; Refill: 1 -     Comprehensive metabolic panel -     Lipid panel  NAFLD (nonalcoholic fatty liver disease) Assessment & Plan: Improving previously, I expect continued improvement with weight changes, healthier diet and exercise.  Commended on his efforts.  Repeat CMP.  Orders: -     Comprehensive  metabolic panel  Elevated LFTs -     Comprehensive metabolic panel  Bowel habit changes  -Increase fluid intake, fiber in diet, additional fiber supplement if needed.  Due for colonoscopy in August, but may need GI eval sooner if bowel habits do not normalize.  RTC precautions, or call if not improving in the next few weeks and I can refer him to gastroenterology sooner.   Patient Instructions  Make sure to drink plenty of water, fiber in diet or add fiber (metamucil or citrucel). If constipation occurs try over the counter miralax if needed. If bowels do not normalize, I recommend discussing with gastroenterology as you may need colonoscopy earlier than in August. Let me know.   No med changes today.     Signed,   [name], MD [place] 05/24/22 8:28 AM

## 2022-05-24 NOTE — Patient Instructions (Addendum)
Make sure to drink plenty of water, fiber in diet or add fiber (metamucil or citrucel). If constipation occurs try over the counter miralax if needed. If bowels do not normalize, I recommend discussing with gastroenterology as you may need colonoscopy earlier than in August. Let me know.   No med changes today.

## 2022-05-24 NOTE — Assessment & Plan Note (Signed)
Improving previously, I expect continued improvement with weight changes, healthier diet and exercise.  Commended on his efforts.  Repeat CMP.

## 2022-07-06 IMAGING — MR MR FOOT*R* W/O CM
4 of 5 series · 18 of 40 positions shown · non-contrast
Comparison: X-ray 02/01/2021.

CLINICAL DATA: Right forefoot numbness and tingling for 1-2 years.

EXAM:
MRI OF THE RIGHT FOREFOOT WITHOUT CONTRAST
TECHNIQUE: Multiplanar, multisequence MR imaging of the right forefoot was
performed. No intravenous contrast was administered.

[Series 4: T1 · coronal · 3.0mm · 0.19mm/px · 3 of 44 slices shown (1 of 2)]
[im 5/44]
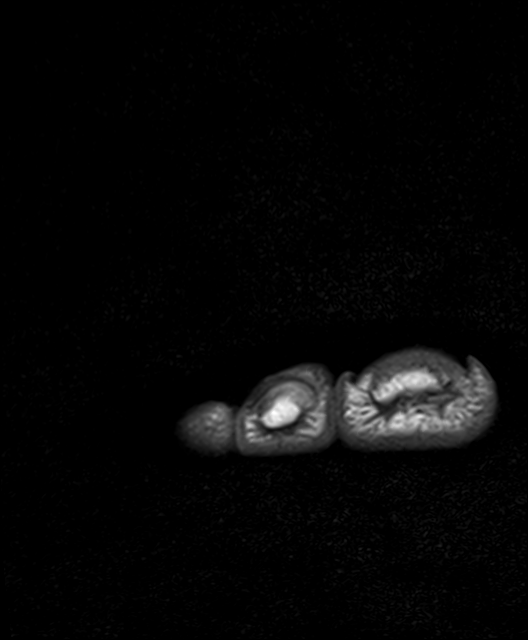
[im 24/44]
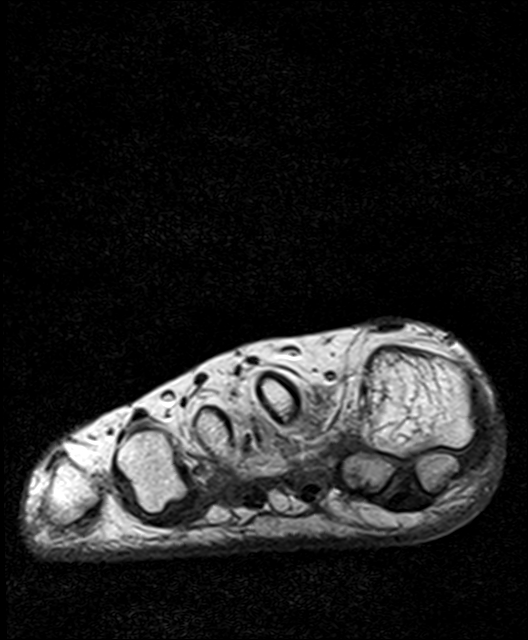
[im 39/44]
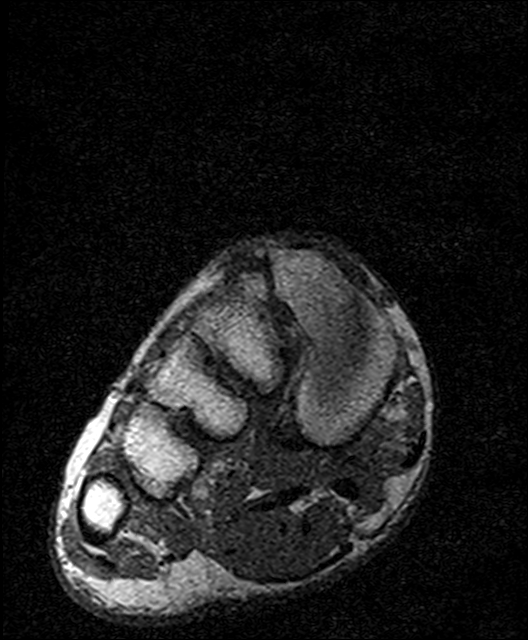

[Series 5: T2 fat-sat · coronal · 3.0mm · 0.19mm/px · 9 of 44 slices shown (1 of 2)]
[im 1/44]
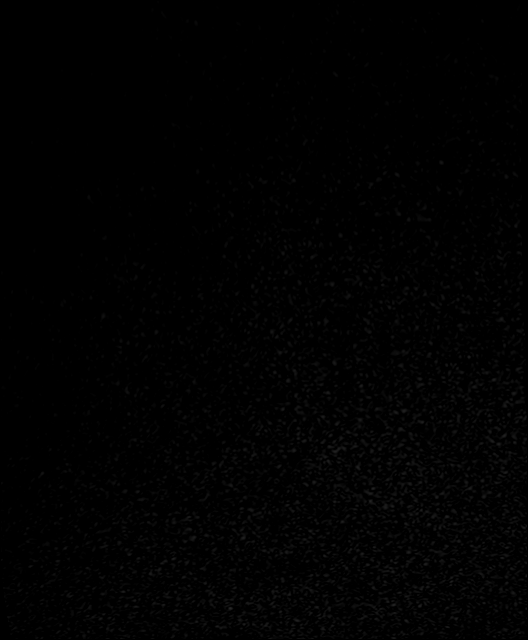
[im 5/44]
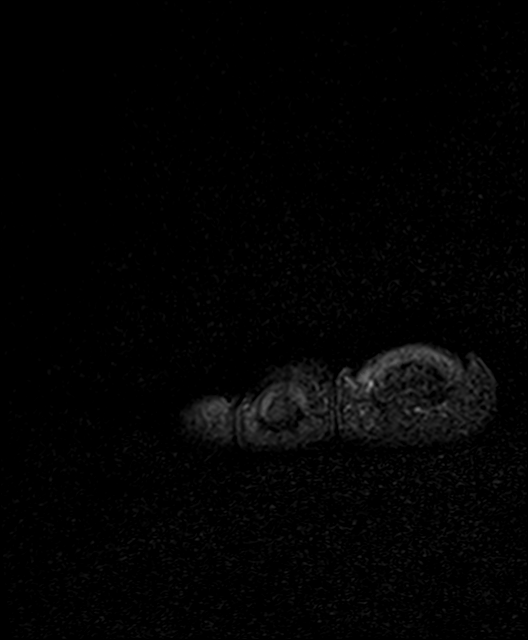
[im 9/44]
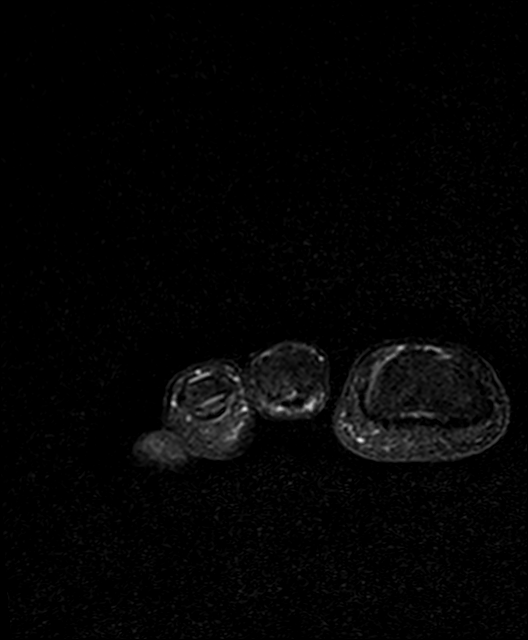
[im 13/44]
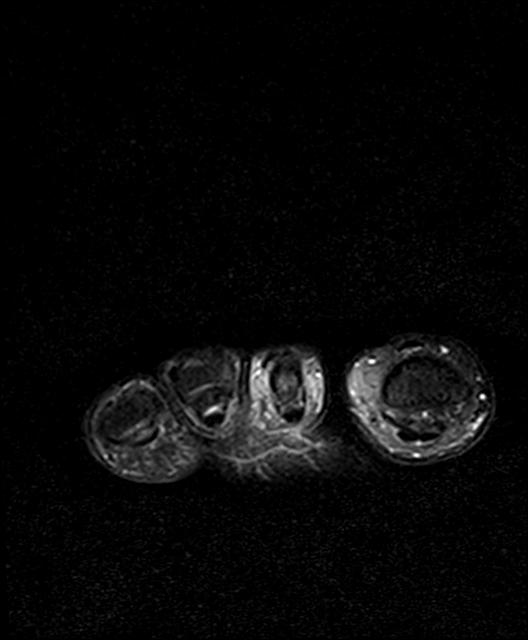
[im 18/44]
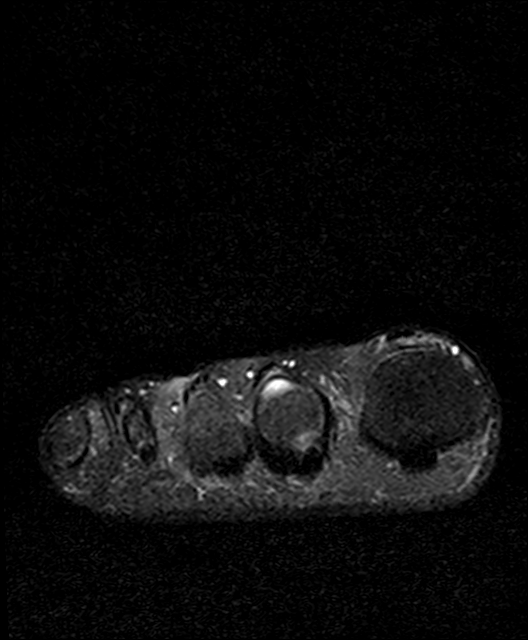
[im 22/44]
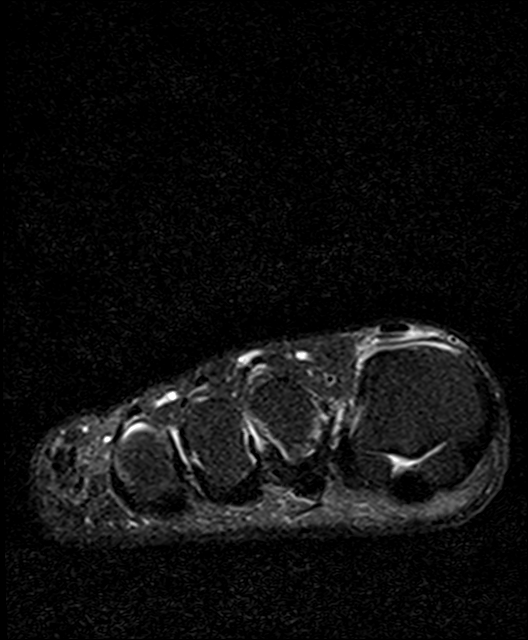
[im 26/44]
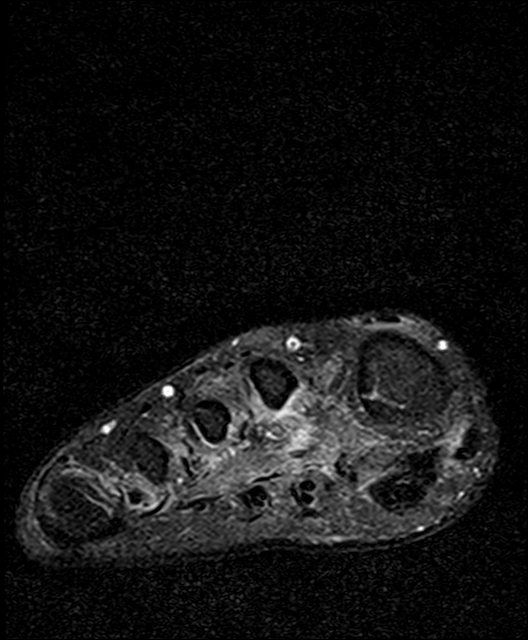
[im 31/44]
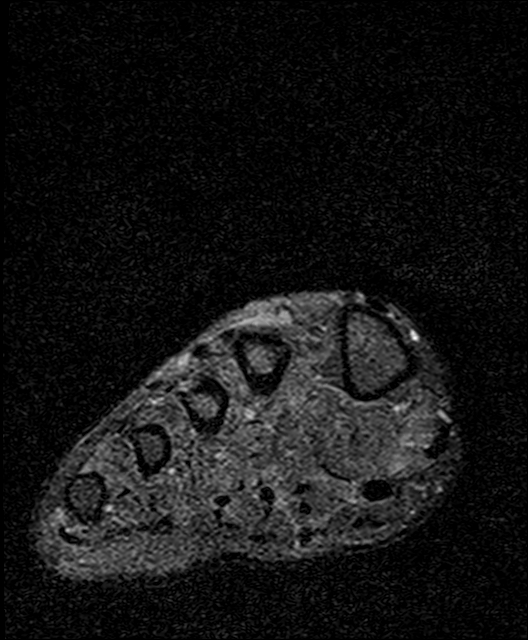
[im 39/44]
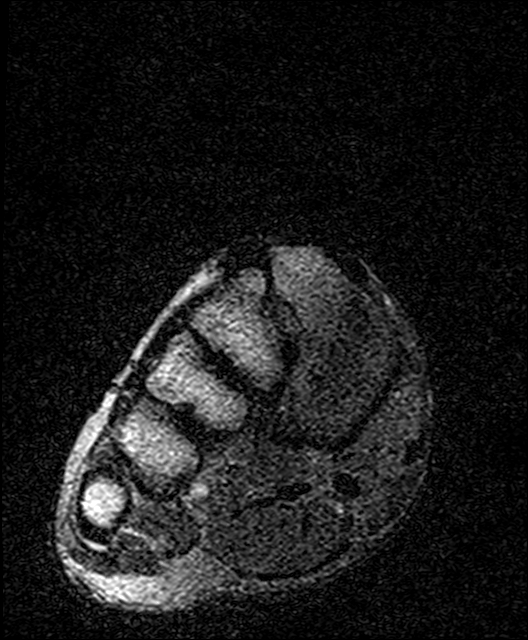

[Series 6: T2 fat-sat · axial · 3.0mm · 0.35mm/px · z∈[-185,-121]mm · 3 of 22 slices shown (2 of 2)]
[im 5/22]
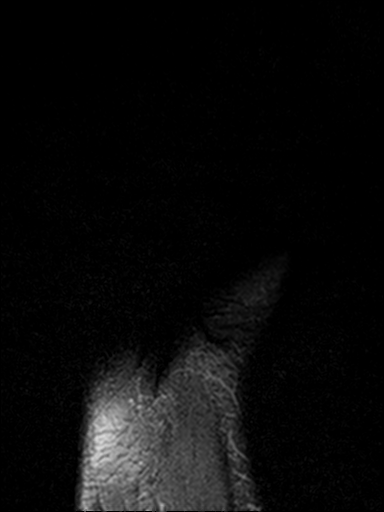
[im 13/22]
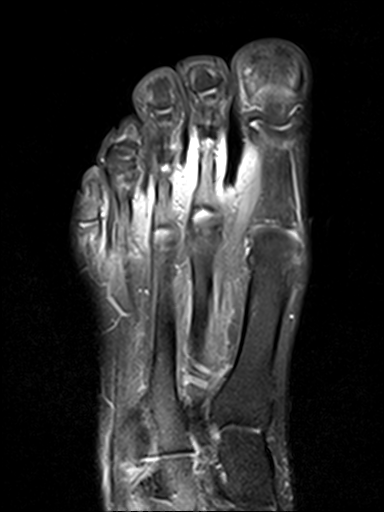
[im 22/22]
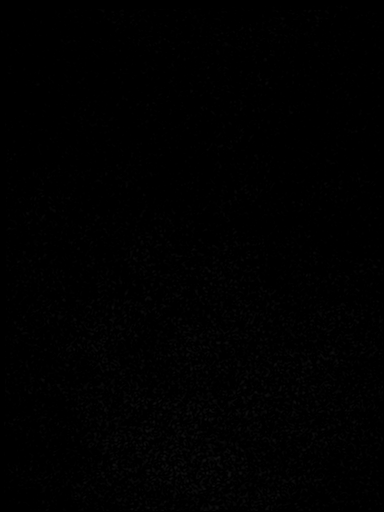

[Series 7: T1 · axial · 3.0mm · 0.35mm/px · z∈[-196,-125]mm · 3 of 20 slices shown (2 of 2)]
[im 1/20]
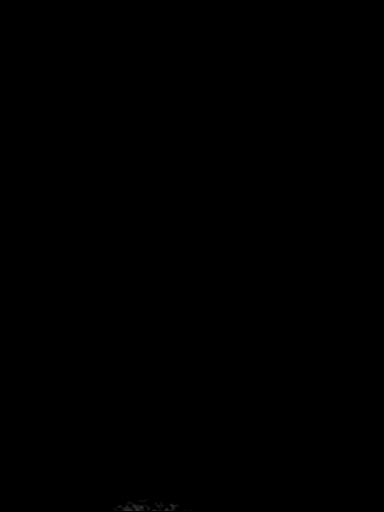
[im 10/20]
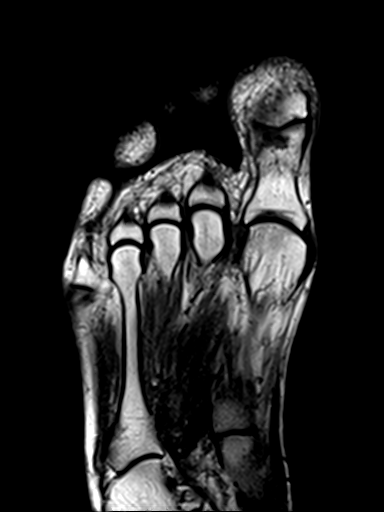
[im 20/20]
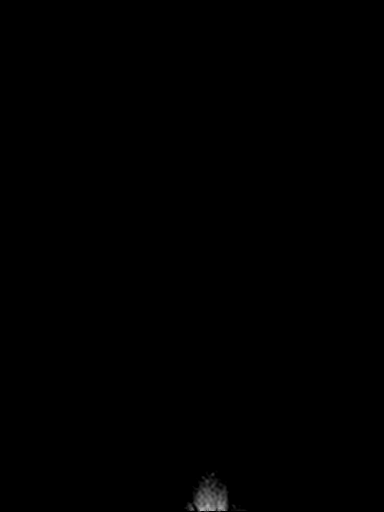

[18 of 40 positions shown; findings below may reference images not displayed]

FINDINGS: Bones/Joint/Cartilage

No fracture or dislocation. Normal alignment. No joint effusion. No
marrow signal abnormality.

Ligaments

Collateral ligaments are intact.  Lisfranc ligament is intact.

Muscles and Tendons

Flexor, peroneal and extensor compartment tendons are intact.
Muscles are normal.

Soft tissue
No fluid collection or hematoma. No soft tissue mass. Mild soft
tissue edema around the plantar aspect of the second metatarsal neck
which may reflect a soft tissue contusion or mild muscle strain.
IMPRESSION: 1.  No acute osseous injury of the right foot.
2. Mild soft tissue edema around the plantar aspect of the second
metatarsal neck which may reflect a soft tissue contusion or mild
muscle strain.

## 2022-08-22 ENCOUNTER — Encounter: Payer: Self-pay | Admitting: Gastroenterology

## 2022-09-04 ENCOUNTER — Encounter: Payer: Self-pay | Admitting: Gastroenterology

## 2022-10-10 ENCOUNTER — Ambulatory Visit (AMBULATORY_SURGERY_CENTER): Payer: 59

## 2022-10-10 VITALS — Ht 73.0 in | Wt 201.0 lb

## 2022-10-10 DIAGNOSIS — Z1211 Encounter for screening for malignant neoplasm of colon: Secondary | ICD-10-CM

## 2022-10-10 MED ORDER — ONDANSETRON HCL 4 MG PO TABS: 4.0000 mg | ORAL_TABLET | ORAL | 0 refills | Status: DC

## 2022-10-10 MED ORDER — NA SULFATE-K SULFATE-MG SULF 17.5-3.13-1.6 GM/177ML PO SOLN: 1.0000 | Freq: Once | ORAL | 0 refills | Status: AC

## 2022-10-10 NOTE — Progress Notes (Signed)
No egg or soy allergy known to patient  No issues known to pt with past sedation with any surgeries or procedures Patient denies ever being told they had issues or difficulty with intubation  No FH of Malignant Hyperthermia Pt is not on diet pills Pt is not on  home 02  Pt is not on blood thinners  Pt denies issues with constipation  No A fib or A flutter Have any cardiac testing pending--no Pt instructed to use Singlecare.com or GoodRx for a price reduction on prep  Patient's chart reviewed by Kyle Swanson CNRA prior to previsit and patient appropriate for the [facility].  Previsit completed and red dot placed by patient's name on their procedure day (on provider's schedule).   Can ambulate independently 

## 2022-10-23 ENCOUNTER — Encounter: Payer: Self-pay | Admitting: Gastroenterology

## 2022-10-24 ENCOUNTER — Encounter (INDEPENDENT_AMBULATORY_CARE_PROVIDER_SITE_OTHER): Payer: Self-pay

## 2022-10-30 ENCOUNTER — Ambulatory Visit: Payer: 59 | Admitting: Gastroenterology

## 2022-10-30 ENCOUNTER — Encounter: Payer: Self-pay | Admitting: Gastroenterology

## 2022-10-30 VITALS — BP 140/85 | HR 67 | Temp 97.3°F | Resp 19 | Ht 73.0 in | Wt 201.0 lb

## 2022-10-30 DIAGNOSIS — Z86010 Personal history of colonic polyps: Secondary | ICD-10-CM | POA: Diagnosis not present

## 2022-10-30 DIAGNOSIS — D122 Benign neoplasm of ascending colon: Secondary | ICD-10-CM | POA: Diagnosis not present

## 2022-10-30 DIAGNOSIS — D120 Benign neoplasm of cecum: Secondary | ICD-10-CM

## 2022-10-30 DIAGNOSIS — Z09 Encounter for follow-up examination after completed treatment for conditions other than malignant neoplasm: Secondary | ICD-10-CM

## 2022-10-30 DIAGNOSIS — Z1211 Encounter for screening for malignant neoplasm of colon: Secondary | ICD-10-CM

## 2022-10-30 DIAGNOSIS — K635 Polyp of colon: Secondary | ICD-10-CM | POA: Diagnosis not present

## 2022-10-30 MED ORDER — SODIUM CHLORIDE 0.9 % IV SOLN: 500.0000 mL | Freq: Once | INTRAVENOUS | Status: DC

## 2022-10-30 NOTE — Patient Instructions (Signed)
Await pathology results.  Resume previous diet. Continue present medications.  Handout on polyps and hemorrhoids provided.  YOU HAD AN ENDOSCOPIC PROCEDURE TODAY AT THE [facility]:   Refer to the procedure report that was given to you for any specific questions about what was found during the examination.  If the procedure report does not answer your questions, please call your gastroenterologist to clarify.  If you requested that your care partner not be given the details of your procedure findings, then the procedure report has been included in a sealed envelope for you to review at your convenience later.  YOU SHOULD EXPECT: Some feelings of bloating in the abdomen. Passage of more gas than usual.  Walking can help get rid of the air that was put into your GI tract during the procedure and reduce the bloating. If you had a lower endoscopy (such as a colonoscopy or flexible sigmoidoscopy) you may notice spotting of blood in your stool or on the toilet paper. If you underwent a bowel prep for your procedure, you may not have a normal bowel movement for a few days.  Please Note:  You might notice some irritation and congestion in your nose or some drainage.  This is from the oxygen used during your procedure.  There is no need for concern and it should clear up in a day or so.  SYMPTOMS TO REPORT IMMEDIATELY:  Following lower endoscopy (colonoscopy or flexible sigmoidoscopy):  Excessive amounts of blood in the stool  Significant tenderness or worsening of abdominal pains  Swelling of the abdomen that is new, acute  Fever of 100F or higher   For urgent or emergent issues, a gastroenterologist can be reached at any hour by calling [phone]. Do not use MyChart messaging for urgent concerns.    DIET:  We do recommend a small meal at first, but then you may proceed to your regular diet.  Drink plenty of fluids but you should avoid alcoholic beverages for 24  hours.  ACTIVITY:  You should plan to take it easy for the rest of today and you should NOT DRIVE or use heavy machinery until tomorrow (because of the sedation medicines used during the test).    FOLLOW UP: Our staff will call the number listed on your records the next business day following your procedure.  We will call around 7:15- 8:00 am to check on you and address any questions or concerns that you may have regarding the information given to you following your procedure. If we do not reach you, we will leave a message.     If any biopsies were taken you will be contacted by phone or by letter within the next 1-3 weeks.  Please call us at [phone] if you have not heard about the biopsies in 3 weeks.    SIGNATURES/CONFIDENTIALITY: You and/or your care partner have signed paperwork which will be entered into your electronic medical record.  These signatures attest to the fact that that the information above on your After Visit Summary has been reviewed and is understood.  Full responsibility of the confidentiality of this discharge information lies with you and/or your care-partner.  

## 2022-10-30 NOTE — Progress Notes (Signed)
Called to room to assist during endoscopic procedure.  Patient ID and intended procedure confirmed with present staff. Received instructions for my participation in the procedure from the performing physician.  

## 2022-10-30 NOTE — Progress Notes (Signed)
To pacu, VSS. Report to [email or web address] 

## 2022-10-30 NOTE — Progress Notes (Signed)
VS by DT  Pt's states no medical or surgical changes since previsit or office visit.  

## 2022-10-30 NOTE — Progress Notes (Signed)
History and Physical:  This patient presents for endoscopic testing for: Encounter Diagnosis  Name Primary?   Special screening for malignant neoplasms, colon Yes    Surveillance colonoscopy today Diminutive TA polyp August 2019 Patient denies chronic abdominal pain, rectal bleeding, constipation or diarrhea.   Patient is otherwise without complaints or active issues today.   Past Medical History: Past Medical History:  Diagnosis Date   Allergic rhinitis 04/28/2012   Allergy    Anxiety    Asthma    as a child - no meds or inhalers now   Closed fracture of right distal radius    Hyperlipidemia      Past Surgical History: Past Surgical History:  Procedure Laterality Date   GANGLION CYST EXCISION     left wrist 10 years ago   OPEN REDUCTION INTERNAL FIXATION (ORIF) DISTAL RADIAL FRACTURE Right 02/11/2019   Procedure: OPEN REDUCTION INTERNAL FIXATION (ORIF) DISTAL RADIAL FRACTURE;  Surgeon: Mack Hook, MD;  Location: [facility];  Service: Orthopedics;  Laterality: Right;  SURGERY REQUEST TIME: 90 MINUTES PRE OP REGIONAL   TONSILLECTOMY AND ADENOIDECTOMY     childhood    Allergies: Allergies  Allergen Reactions   Sulfa Antibiotics Rash    Outpatient Meds: Current Outpatient Medications  Medication Sig Dispense Refill   atorvastatin (LIPITOR) 10 MG tablet TAKE 1 TABLET BY MOUTH EVERY DAY. 90 tablet 1   B Complex-C (SUPER B COMPLEX PO)      Cholecalciferol (VITAMIN D3) 10 MCG (400 UNIT) CAPS      doxycycline (MONODOX) 100 MG capsule Take 100 mg by mouth daily.     fluticasone (FLONASE) 50 MCG/ACT nasal spray Place into both nostrils daily.     loratadine (CLARITIN) 10 MG tablet Take 10 mg by mouth daily.     tamsulosin (FLOMAX) 0.4 MG CAPS capsule Take 1 capsule by mouth daily.     meloxicam (MOBIC) 15 MG tablet Take 15 mg by mouth daily.     Current Facility-Administered Medications  Medication Dose Route Frequency Provider Last Rate Last Admin    0.9 %  sodium chloride infusion  500 mL Intravenous Once Danis, Starr Lake III, MD          ___________________________________________________________________ Objective   Exam:  BP 121/70   Pulse 69   Temp (!) 97.3 F (36.3 C) (Skin)   Ht 6\' 1"  (1.854 m)   Wt 201 lb (91.2 kg)   SpO2 95%   BMI 26.52 kg/m   CV: regular , S1/S2 Resp: clear to auscultation bilaterally, normal RR and effort noted GI: soft, no tenderness, with active bowel sounds.   Assessment: Encounter Diagnosis  Name Primary?   Special screening for malignant neoplasms, colon Yes     Plan: Colonoscopy   The benefits and risks of the planned procedure were described in detail with the patient or (when appropriate) their health care proxy.  Risks were outlined as including, but not limited to, bleeding, infection, perforation, adverse medication reaction leading to cardiac or pulmonary decompensation, pancreatitis (if ERCP).  The limitation of incomplete mucosal visualization was also discussed.  No guarantees or warranties were given.  The patient is appropriate for an endoscopic procedure in the ambulatory setting.   - Amada Jupiter, MD

## 2022-10-30 NOTE — Op Note (Signed)
[facility] Patient Name: Kyle Swanson Procedure Date: 10/30/2022 9:34 AM MRN: [id number] Endoscopist: Sherilyn Cooter L. Myrtie Neither , MD, [id number] Age: 57 Referring MD:  Date of Birth: Jun 25, 1965 Gender: Male Account #: [id number] Procedure:                Colonoscopy Indications:              Surveillance: Personal history of adenomatous                            polyps on last colonoscopy 5 years ago                           diminutive TA August 2019 Medicines:                Monitored Anesthesia Care Procedure:                Pre-Anesthesia Assessment:                           - Prior to the procedure, a History and Physical                            was performed, and patient medications and                            allergies were reviewed. The patient's tolerance of                            previous anesthesia was also reviewed. The risks                            and benefits of the procedure and the sedation                            options and risks were discussed with the patient.                            All questions were answered, and informed consent                            was obtained. Prior Anticoagulants: The patient has                            taken no anticoagulant or antiplatelet agents. ASA                            Grade Assessment: II - A patient with mild systemic                            disease. After reviewing the risks and benefits,                            the patient was deemed in satisfactory condition to  undergo the procedure.                           After obtaining informed consent, the colonoscope                            was passed under direct vision. Throughout the                            procedure, the patient's blood pressure, pulse, and                            oxygen saturations were monitored continuously. The                            CF [id number] #[id number] was introduced through the  anus                            and advanced to the the cecum, identified by                            appendiceal orifice and ileocecal valve. The                            colonoscopy was performed without difficulty. The                            patient tolerated the procedure well. The quality                            of the bowel preparation was good. The ileocecal                            valve, appendiceal orifice, and rectum were                            photographed. Scope In: 9:45:19 AM Scope Out: 10:00:56 AM Scope Withdrawal Time: 0 hours 12 minutes 18 seconds  Total Procedure Duration: 0 hours 15 minutes 37 seconds  Findings:                 The perianal and digital rectal examinations were                            normal.                           Repeat examination of right colon under NBI                            performed.                           Two sessile polyps were found in the ascending  colon and cecum. The polyps were 3 to 6 mm in size.                            These polyps were removed with a cold snare.                            Resection and retrieval were complete.                           Internal hemorrhoids were found.                           Anal papilla(e) were hypertrophied.                           The exam was otherwise without abnormality on                            direct and retroflexion views. Complications:            No immediate complications. Estimated Blood Loss:     Estimated blood loss was minimal. Impression:               - Two 3 to 6 mm polyps in the ascending colon and                            in the cecum, removed with a cold snare. Resected                            and retrieved.                           - Internal hemorrhoids.                           - Anal papilla(e) were hypertrophied.                           - The examination was otherwise normal on direct                             and retroflexion views. Recommendation:           - Patient has a contact number available for                            emergencies. The signs and symptoms of potential                            delayed complications were discussed with the                            patient. Return to normal activities tomorrow.                            Written discharge instructions were provided to the  patient.                           - Resume previous diet.                           - Continue present medications.                           - Await pathology results.                           - Repeat colonoscopy is recommended for                            surveillance. The colonoscopy date will be                            determined after pathology results from today's                            exam become available for review. Michaelpaul Apo L. Myrtie Neither, MD 10/30/2022 10:08:17 AM This report has been signed electronically.

## 2022-10-31 ENCOUNTER — Telehealth: Payer: Self-pay

## 2022-10-31 NOTE — Telephone Encounter (Signed)
No answer, left message to call if having any issues or concerns, B.Tayva Easterday RN 

## 2022-11-07 ENCOUNTER — Encounter: Payer: Self-pay | Admitting: Gastroenterology

## 2022-11-22 ENCOUNTER — Ambulatory Visit (INDEPENDENT_AMBULATORY_CARE_PROVIDER_SITE_OTHER): Payer: 59 | Admitting: Family Medicine

## 2022-11-22 ENCOUNTER — Encounter: Payer: Self-pay | Admitting: Family Medicine

## 2022-11-22 VITALS — BP 124/76 | HR 76 | Temp 97.8°F | Ht 73.0 in | Wt 200.6 lb

## 2022-11-22 DIAGNOSIS — Z130 Encounter for screening for diseases of the blood and blood-forming organs and certain disorders involving the immune mechanism: Secondary | ICD-10-CM

## 2022-11-22 DIAGNOSIS — Z23 Encounter for immunization: Secondary | ICD-10-CM | POA: Diagnosis not present

## 2022-11-22 DIAGNOSIS — Z131 Encounter for screening for diabetes mellitus: Secondary | ICD-10-CM

## 2022-11-22 DIAGNOSIS — Z0000 Encounter for general adult medical examination without abnormal findings: Secondary | ICD-10-CM | POA: Diagnosis not present

## 2022-11-22 DIAGNOSIS — E785 Hyperlipidemia, unspecified: Secondary | ICD-10-CM | POA: Diagnosis not present

## 2022-11-22 DIAGNOSIS — Z1329 Encounter for screening for other suspected endocrine disorder: Secondary | ICD-10-CM

## 2022-11-22 LAB — LIPID PANEL
Cholesterol: 154 mg/dL (ref 0–200)
HDL: 60.8 mg/dL (ref >39.00)
LDL Cholesterol: 78 mg/dL (ref 0–99)
NonHDL: 92.84
Total CHOL/HDL Ratio: 3
Triglycerides: 72 mg/dL (ref 0.0–149.0)
VLDL: 14.4 mg/dL (ref 0.0–40.0)

## 2022-11-22 LAB — COMPREHENSIVE METABOLIC PANEL
ALT: 20 U/L (ref 0–53)
AST: 17 U/L (ref 0–37)
Albumin: 4.3 g/dL (ref 3.5–5.2)
Alkaline Phosphatase: 64 U/L (ref 39–117)
BUN: 16 mg/dL (ref 6–23)
CO2: 29 mEq/L (ref 19–32)
Calcium: 9.5 mg/dL (ref 8.4–10.5)
Chloride: 102 mEq/L (ref 96–112)
Creatinine, Ser: 0.98 mg/dL (ref 0.40–1.50)
GFR: 86.68 mL/min (ref >60.00)
Glucose, Bld: 90 mg/dL (ref 70–99)
Potassium: 4 mEq/L (ref 3.5–5.1)
Sodium: 139 mEq/L (ref 135–145)
Total Bilirubin: 0.9 mg/dL (ref 0.2–1.2)
Total Protein: 6.8 g/dL (ref 6.0–8.3)

## 2022-11-22 LAB — CBC WITH DIFFERENTIAL/PLATELET
Basophils Absolute: 0 10*3/uL (ref 0.0–0.1)
Basophils Relative: 0.3 % (ref 0.0–3.0)
Eosinophils Absolute: 0.2 10*3/uL (ref 0.0–0.7)
Eosinophils Relative: 3.7 % (ref 0.0–5.0)
HCT: 47.3 % (ref 39.0–52.0)
Hemoglobin: 15.8 g/dL (ref 13.0–17.0)
Lymphocytes Relative: 32.9 % (ref 12.0–46.0)
Lymphs Abs: 1.9 10*3/uL (ref 0.7–4.0)
MCHC: 33.4 g/dL (ref 30.0–36.0)
MCV: 89.3 fl (ref 78.0–100.0)
Monocytes Absolute: 0.5 10*3/uL (ref 0.1–1.0)
Monocytes Relative: 8.7 % (ref 3.0–12.0)
Neutro Abs: 3.2 10*3/uL (ref 1.4–7.7)
Neutrophils Relative %: 54.4 % (ref 43.0–77.0)
Platelets: 195 10*3/uL (ref 150.0–400.0)
RBC: 5.29 Mil/uL (ref 4.22–5.81)
RDW: 14 % (ref 11.5–15.5)
WBC: 5.9 10*3/uL (ref 4.0–10.5)

## 2022-11-22 LAB — HEMOGLOBIN A1C: Hgb A1c MFr Bld: 5.6 % (ref 4.6–6.5)

## 2022-11-22 LAB — TSH: TSH: 2.59 u[IU]/mL (ref 0.35–5.50)

## 2022-11-22 MED ORDER — ATORVASTATIN CALCIUM 10 MG PO TABS
ORAL_TABLET | ORAL | 1 refills | Status: DC
Start: 2022-11-22 — End: 2023-05-27

## 2022-11-22 NOTE — Patient Instructions (Signed)
Thank for coming today.  No med changes at this time.  If any concerns on labs I will let you know. Preventive Care 66-56 Years Old, Male Preventive care refers to lifestyle choices and visits with your health care provider that can promote health and wellness. Preventive care visits are also called wellness exams. What can I expect for my preventive care visit? Counseling During your preventive care visit, your health care provider may ask about your: Medical history, including: Past medical problems. Family medical history. Current health, including: Emotional well-being. Home life and relationship well-being. Sexual activity. Lifestyle, including: Alcohol, nicotine or tobacco, and drug use. Access to firearms. Diet, exercise, and sleep habits. Safety issues such as seatbelt and bike helmet use. Sunscreen use. Work and work Astronomer. Physical exam Your health care provider will check your: Height and weight. These may be used to calculate your BMI (body mass index). BMI is a measurement that tells if you are at a healthy weight. Waist circumference. This measures the distance around your waistline. This measurement also tells if you are at a healthy weight and may help predict your risk of certain diseases, such as type 2 diabetes and high blood pressure. Heart rate and blood pressure. Body temperature. Skin for abnormal spots. What immunizations do I need?  Vaccines are usually given at various ages, according to a schedule. Your health care provider will recommend vaccines for you based on your age, medical history, and lifestyle or other factors, such as travel or where you work. What tests do I need? Screening Your health care provider may recommend screening tests for certain conditions. This may include: Lipid and cholesterol levels. Diabetes screening. This is done by checking your blood sugar (glucose) after you have not eaten for a while (fasting). Hepatitis B  test. Hepatitis C test. HIV (human immunodeficiency virus) test. STI (sexually transmitted infection) testing, if you are at risk. Lung cancer screening. Prostate cancer screening. Colorectal cancer screening. Talk with your health care provider about your test results, treatment options, and if necessary, the need for more tests. Follow these instructions at home: Eating and drinking  Eat a diet that includes fresh fruits and vegetables, whole grains, lean protein, and low-fat dairy products. Take vitamin and mineral supplements as recommended by your health care provider. Do not drink alcohol if your health care provider tells you not to drink. If you drink alcohol: Limit how much you have to 0-2 drinks a day. Know how much alcohol is in your drink. In the [place] one drink equals one 12 oz bottle of beer (355 mL), one 5 oz glass of wine (148 mL), or one 1 oz glass of hard liquor (44 mL). Lifestyle Brush your teeth every morning and night with fluoride toothpaste. Floss one time each day. Exercise for at least 30 minutes 5 or more days each week. Do not use any products that contain nicotine or tobacco. These products include cigarettes, chewing tobacco, and vaping devices, such as e-cigarettes. If you need help quitting, ask your health care provider. Do not use drugs. If you are sexually active, practice safe sex. Use a condom or other form of protection to prevent STIs. Take aspirin only as told by your health care provider. Make sure that you understand how much to take and what form to take. Work with your health care provider to find out whether it is safe and beneficial for you to take aspirin daily. Find healthy ways to manage stress, such as: Meditation, yoga, or  listening to music. Journaling. Talking to a trusted person. Spending time with friends and family. Minimize exposure to UV radiation to reduce your risk of skin cancer. Safety Always wear your seat belt while  driving or riding in a vehicle. Do not drive: If you have been drinking alcohol. Do not ride with someone who has been drinking. When you are tired or distracted. While texting. If you have been using any mind-altering substances or drugs. Wear a helmet and other protective equipment during sports activities. If you have firearms in your house, make sure you follow all gun safety procedures. What's next? Go to your health care provider once a year for an annual wellness visit. Ask your health care provider how often you should have your eyes and teeth checked. Stay up to date on all vaccines. This information is not intended to replace advice given to you by your health care provider. Make sure you discuss any questions you have with your health care provider. Document Revised: 09/07/2020 Document Reviewed: 09/07/2020 Elsevier Patient Education  2024 [employer].

## 2022-11-22 NOTE — Progress Notes (Signed)
Subjective:  Patient ID: Kyle Swanson, male    DOB: [birth year]  Age: 57 y.o. MRN: [id number]  CC:  Chief Complaint  Patient presents with   Annual Exam    Pt notes went to dentist for tooth ache over the weekend notes saw dentist and there was no concerns they asked if he had a sinus infection recently so patient wanted to see about having this checked     HPI Kyle Swanson presents for Annual Exam and other concerns above  Toothache: Seen by dentist recently for tooth pain, and inquired whether or not he may have a sinus infection. Had xray - no concerns. Pain resolved few days ago. Some prior head pressure. Doing ok now. Has been using flonase and claritin. No flair in allergies.   Hyperlipidemia: Lipitor 10 mg daily. No new myalgias/side effects.  Lab Results  Component Value Date   CHOL 153 05/24/2022   HDL 48.90 05/24/2022   LDLCALC 89 05/24/2022   TRIG 79.0 05/24/2022   CHOLHDL 3 05/24/2022   Lab Results  Component Value Date   ALT 50 05/24/2022   AST 58 (H) 05/24/2022   ALKPHOS 58 05/24/2022   BILITOT 0.8 05/24/2022   Elevated LFTs with NAFLD Seen by gastroenterology previously.  Elevated ferritin with secondary iron overload, no sign of hemochromatosis.  Ongoing monitoring of LFTs planned.  Weight was improving in February. No abd pain, n/v/ or jaundice. Alcohol - 3-5 beers per week.  Off for a few months prior.   Lab Results  Component Value Date   ALT 50 05/24/2022   AST 58 (H) 05/24/2022   ALKPHOS 58 05/24/2022   BILITOT 0.8 05/24/2022   Wt Readings from Last 3 Encounters:  11/22/22 200 lb 9.6 oz (91 kg)  10/30/22 201 lb (91.2 kg)  10/10/22 201 lb (91.2 kg)  Weight 205 in 04/2022.   Allergic rhinitis Treated with Flonase, Claritin as above - stable.   BPH with LUTS Followed by urology, appt in June, treated with Flomax, PSA 0.9.      11/22/2022    8:11 AM 05/24/2022    8:02 AM 11/23/2021    8:06 AM 09/14/2021   10:14 AM 03/16/2021   11:50 AM   Depression screen PHQ 2/9  Decreased Interest 0 0 0 0 0  Down, Depressed, Hopeless 0 0 0 0 0  PHQ - 2 Score 0 0 0 0 0  Altered sleeping 0 0  1 1  Tired, decreased energy 0 0  1 1  Change in appetite 0 0  0 0  Feeling bad or failure about yourself  0 0  0 0  Trouble concentrating 0 0  0 0  Moving slowly or fidgety/restless 0 0  0 0  Suicidal thoughts 0 0  0 0  PHQ-9 Score 0 0  2 2  Difficult doing work/chores     Not difficult at all    Health Maintenance  Topic Date Due   COVID-19 Vaccine (5 - 2023-24 season) 11/24/2021   INFLUENZA VACCINE  10/25/2022   DTaP/Tdap/Td (3 - Td or Tdap) 06/22/2024   Colonoscopy  10/30/2027   Hepatitis C Screening  Completed   HIV Screening  Completed   Zoster Vaccines- Shingrix  Completed   HPV VACCINES  Aged Out  Colonoscopy - 10/30/22, Dr. Myrtie Neither, 2 polyps, repeat 5 yrs.  Prostate: followed by urology.  Lab Results  Component Value Date   PSA1 0.7 01/02/2019   PSA1 0.8 08/26/2018   PSA1  1.1 06/27/2017   PSA 0.83 01/31/2015   PSA 1.16 10/06/2012   PSA 1.11 04/28/2012  Derm - [facility]. No skin CA.  Appt earlier this year.   Immunization History  Administered Date(s) Administered   Influenza, Seasonal, Injecte, Preservative Fre 04/28/2012   Influenza,inj,Quad PF,6+ Mos 01/04/2017, 12/07/2017, 12/16/2018, 11/23/2021   Influenza,inj,quad, With Preservative 02/18/2016   Influenza-Unspecified 12/25/2014, 01/19/2016, 01/04/2017, 12/07/2017, 12/02/2020   Moderna Sars-Covid-2 Vaccination 06/04/2019, 07/02/2019, 02/22/2020, 01/09/2021   Td 06/23/2014   Tdap 04/28/2012   Zoster Recombinant(Shingrix) 10/11/2017, 12/07/2017  Declines further covid boosters.  Flu vaccine today.   No results found. 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 Early cataract. No procedure recommended.   Dental:every 6 months   Alcohol:3-5 beers per week.   Tobacco: none.   Exercise:2-3 days per week usually - 55min-1hr.      History Patient Active Problem List   Diagnosis Date Noted   Hyperlipidemia 05/24/2022   NAFLD (nonalcoholic fatty liver disease) 62/95/2841   General weakness 08/09/2016   Viral illness 08/09/2016   Palpitations 01/05/2013   Allergy    Asthma    Allergic rhinitis 04/28/2012   Routine health maintenance 09/06/2011   Past Medical History:  Diagnosis Date   Allergic rhinitis 04/28/2012   Allergy    Anxiety    Asthma    as a child - no meds or inhalers now   Closed fracture of right distal radius    Hyperlipidemia    Past Surgical History:  Procedure Laterality Date   GANGLION CYST EXCISION     left wrist 10 years ago   OPEN REDUCTION INTERNAL FIXATION (ORIF) DISTAL RADIAL FRACTURE Right 02/11/2019   Procedure: OPEN REDUCTION INTERNAL FIXATION (ORIF) DISTAL RADIAL FRACTURE;  Surgeon: Mack Hook, MD;  Location: [facility];  Service: Orthopedics;  Laterality: Right;  SURGERY REQUEST TIME: 90 MINUTES PRE OP REGIONAL   TONSILLECTOMY AND ADENOIDECTOMY     childhood   Allergies  Allergen Reactions   Sulfa Antibiotics Rash   Prior to Admission medications   Medication Sig Start Date End Date Taking? Authorizing Provider  atorvastatin (LIPITOR) 10 MG tablet TAKE 1 TABLET BY MOUTH EVERY DAY. 05/24/22  Yes Shade Flood, MD  B Complex-C (SUPER B COMPLEX PO)  12/07/20  Yes [provider]  Cholecalciferol (VITAMIN D3) 10 MCG (400 UNIT) CAPS  12/07/20  Yes [provider]  doxycycline (MONODOX) 100 MG capsule Take 100 mg by mouth daily. 06/18/21  Yes [provider]  fluticasone (FLONASE) 50 MCG/ACT nasal spray Place into both nostrils daily.   Yes [provider]  loratadine (CLARITIN) 10 MG tablet Take 10 mg by mouth daily.   Yes [provider]  tamsulosin (FLOMAX) 0.4 MG CAPS capsule Take 1 capsule by mouth daily. 07/27/19  Yes [provider]   Social History   Socioeconomic History   Marital  status: Divorced    Spouse name: Not on file   Number of children: Not on file   Years of education: Not on file   Highest education level: Not on file  Occupational History   Occupation: Music therapist [employer]  Tobacco Use   Smoking status: Never   Smokeless tobacco: Never  Vaping Use   Vaping status: Never Used  Substance and Sexual Activity   Alcohol use: Yes    Alcohol/week: 3.0 - 5.0 standard drinks of alcohol    Types: 3 - 5 Cans of beer per week    Comment:  BEER   Drug use: No   Sexual activity: Yes    Comment: number of sex partners in the last 12 months - 4  Other Topics Concern   Not on file  Social History Narrative   Divorced. Education: [employer]. Exercise: No.   Social Determinants of Health   Financial Resource Strain: Not on file  Food Insecurity: Not on file  Transportation Needs: Not on file  Physical Activity: Not on file  Stress: Not on file  Social Connections: Not on file  Intimate Partner Violence: Not on file    Review of Systems  13 point review of systems per patient health survey noted.  Negative other than as indicated above or in HPI.   Objective:   Vitals:   11/22/22 0814  BP: 124/76  Pulse: 76  Temp: 97.8 F (36.6 C)  TempSrc: Temporal  SpO2: 97%  Weight: 200 lb 9.6 oz (91 kg)  Height: 6\' 1"  (1.854 m)     Physical Exam Vitals reviewed.  Constitutional:      Appearance: He is well-developed.  HENT:     Head: Normocephalic and atraumatic.     Right Ear: External ear normal.     Left Ear: External ear normal.  Eyes:     Conjunctiva/sclera: Conjunctivae normal.     Pupils: Pupils are equal, round, and reactive to light.  Neck:     Thyroid: No thyromegaly.  Cardiovascular:     Rate and Rhythm: Normal rate and regular rhythm.     Heart sounds: Normal heart sounds.  Pulmonary:     Effort: Pulmonary effort is normal. No respiratory distress.     Breath sounds: Normal breath sounds. No wheezing.  Abdominal:     General: There  is no distension.     Palpations: Abdomen is soft.     Tenderness: There is no abdominal tenderness.  Musculoskeletal:        General: No tenderness. Normal range of motion.     Cervical back: Normal range of motion and neck supple.  Lymphadenopathy:     Cervical: No cervical adenopathy.  Skin:    General: Skin is warm and dry.  Neurological:     Mental Status: He is alert and oriented to person, place, and time.     Deep Tendon Reflexes: Reflexes are normal and symmetric.  Psychiatric:        Behavior: Behavior normal.     Assessment & Plan:  Kyle Swanson is a 57 y.o. male . Annual physical exam - Plan: CBC with Differential/Platelet, Comprehensive metabolic panel, Lipid panel, TSH, Hemoglobin A1c  - -anticipatory guidance as below in AVS, screening labs above. Health maintenance items as above in HPI discussed/recommended as applicable.   Need for influenza vaccination - Plan: Flu vaccine trivalent PF, 6mos and older(Flulaval,Afluria,Fluarix,Fluzone)  Hyperlipidemia, unspecified hyperlipidemia type - Plan: Comprehensive metabolic panel, Lipid panel, atorvastatin (LIPITOR) 10 MG tablet  -  Stable, tolerating current regimen. Medications refilled. Labs pending as above.   Screening for deficiency anemia - Plan: CBC with Differential/Platelet  Screening for thyroid disorder - Plan: TSH  Screening for diabetes mellitus - Plan: Hemoglobin A1c   Meds ordered this encounter  Medications   atorvastatin (LIPITOR) 10 MG tablet    Sig: TAKE 1 TABLET BY MOUTH EVERY DAY.    Dispense:  90 tablet    Refill:  1   Patient Instructions  Thank for coming today.  No med changes at this time.  If any concerns on labs  I will let you know. Preventive Care 62-46 Years Old, Male Preventive care refers to lifestyle choices and visits with your health care provider that can promote health and wellness. Preventive care visits are also called wellness exams. What can I expect for my preventive  care visit? Counseling During your preventive care visit, your health care provider may ask about your: Medical history, including: Past medical problems. Family medical history. Current health, including: Emotional well-being. Home life and relationship well-being. Sexual activity. Lifestyle, including: Alcohol, nicotine or tobacco, and drug use. Access to firearms. Diet, exercise, and sleep habits. Safety issues such as seatbelt and bike helmet use. Sunscreen use. Work and work Astronomer. Physical exam Your health care provider will check your: Height and weight. These may be used to calculate your BMI (body mass index). BMI is a measurement that tells if you are at a healthy weight. Waist circumference. This measures the distance around your waistline. This measurement also tells if you are at a healthy weight and may help predict your risk of certain diseases, such as type 2 diabetes and high blood pressure. Heart rate and blood pressure. Body temperature. Skin for abnormal spots. What immunizations do I need?  Vaccines are usually given at various ages, according to a schedule. Your health care provider will recommend vaccines for you based on your age, medical history, and lifestyle or other factors, such as travel or where you work. What tests do I need? Screening Your health care provider may recommend screening tests for certain conditions. This may include: Lipid and cholesterol levels. Diabetes screening. This is done by checking your blood sugar (glucose) after you have not eaten for a while (fasting). Hepatitis B test. Hepatitis C test. HIV (human immunodeficiency virus) test. STI (sexually transmitted infection) testing, if you are at risk. Lung cancer screening. Prostate cancer screening. Colorectal cancer screening. Talk with your health care provider about your test results, treatment options, and if necessary, the need for more tests. Follow these  instructions at home: Eating and drinking  Eat a diet that includes fresh fruits and vegetables, whole grains, lean protein, and low-fat dairy products. Take vitamin and mineral supplements as recommended by your health care provider. Do not drink alcohol if your health care provider tells you not to drink. If you drink alcohol: Limit how much you have to 0-2 drinks a day. Know how much alcohol is in your drink. In the [place] one drink equals one 12 oz bottle of beer (355 mL), one 5 oz glass of wine (148 mL), or one 1 oz glass of hard liquor (44 mL). Lifestyle Brush your teeth every morning and night with fluoride toothpaste. Floss one time each day. Exercise for at least 30 minutes 5 or more days each week. Do not use any products that contain nicotine or tobacco. These products include cigarettes, chewing tobacco, and vaping devices, such as e-cigarettes. If you need help quitting, ask your health care provider. Do not use drugs. If you are sexually active, practice safe sex. Use a condom or other form of protection to prevent STIs. Take aspirin only as told by your health care provider. Make sure that you understand how much to take and what form to take. Work with your health care provider to find out whether it is safe and beneficial for you to take aspirin daily. Find healthy ways to manage stress, such as: Meditation, yoga, or listening to music. Journaling. Talking to a trusted person. Spending time with friends and family. Minimize  exposure to UV radiation to reduce your risk of skin cancer. Safety Always wear your seat belt while driving or riding in a vehicle. Do not drive: If you have been drinking alcohol. Do not ride with someone who has been drinking. When you are tired or distracted. While texting. If you have been using any mind-altering substances or drugs. Wear a helmet and other protective equipment during sports activities. If you have firearms in your house, make  sure you follow all gun safety procedures. What's next? Go to your health care provider once a year for an annual wellness visit. Ask your health care provider how often you should have your eyes and teeth checked. Stay up to date on all vaccines. This information is not intended to replace advice given to you by your health care provider. Make sure you discuss any questions you have with your health care provider. Document Revised: 09/07/2020 Document Reviewed: 09/07/2020 Elsevier Patient Education  2024 Elsevier Inc.     Signed,   Meredith Staggers, MD [facility], [facility] 11/22/22 9:07 AM

## 2023-05-27 ENCOUNTER — Encounter: Payer: Self-pay | Admitting: Family Medicine

## 2023-05-27 ENCOUNTER — Ambulatory Visit: Payer: 59 | Admitting: Family Medicine

## 2023-05-27 VITALS — BP 128/80 | HR 73 | Temp 98.0°F | Ht 73.0 in | Wt 220.6 lb

## 2023-05-27 DIAGNOSIS — N400 Enlarged prostate without lower urinary tract symptoms: Secondary | ICD-10-CM

## 2023-05-27 DIAGNOSIS — M25522 Pain in left elbow: Secondary | ICD-10-CM

## 2023-05-27 DIAGNOSIS — M25521 Pain in right elbow: Secondary | ICD-10-CM

## 2023-05-27 DIAGNOSIS — J309 Allergic rhinitis, unspecified: Secondary | ICD-10-CM

## 2023-05-27 DIAGNOSIS — E785 Hyperlipidemia, unspecified: Secondary | ICD-10-CM

## 2023-05-27 DIAGNOSIS — Z23 Encounter for immunization: Secondary | ICD-10-CM

## 2023-05-27 DIAGNOSIS — K760 Fatty (change of) liver, not elsewhere classified: Secondary | ICD-10-CM

## 2023-05-27 LAB — LIPID PANEL
Cholesterol: 187 mg/dL (ref 0–200)
HDL: 60.5 mg/dL (ref >39.00)
LDL Cholesterol: 106 mg/dL — ABNORMAL HIGH (ref 0–99)
NonHDL: 126.7
Total CHOL/HDL Ratio: 3
Triglycerides: 102 mg/dL (ref 0.0–149.0)
VLDL: 20.4 mg/dL (ref 0.0–40.0)

## 2023-05-27 LAB — COMPREHENSIVE METABOLIC PANEL
ALT: 31 U/L (ref 0–53)
AST: 22 U/L (ref 0–37)
Albumin: 4.4 g/dL (ref 3.5–5.2)
Alkaline Phosphatase: 56 U/L (ref 39–117)
BUN: 17 mg/dL (ref 6–23)
CO2: 28 meq/L (ref 19–32)
Calcium: 9.5 mg/dL (ref 8.4–10.5)
Chloride: 103 meq/L (ref 96–112)
Creatinine, Ser: 0.95 mg/dL (ref 0.40–1.50)
GFR: 89.66 mL/min (ref >60.00)
Glucose, Bld: 93 mg/dL (ref 70–99)
Potassium: 3.8 meq/L (ref 3.5–5.1)
Sodium: 139 meq/L (ref 135–145)
Total Bilirubin: 0.8 mg/dL (ref 0.2–1.2)
Total Protein: 7 g/dL (ref 6.0–8.3)

## 2023-05-27 MED ORDER — ATORVASTATIN CALCIUM 10 MG PO TABS: ORAL_TABLET | ORAL | 1 refills | Status: DC

## 2023-05-27 NOTE — Progress Notes (Signed)
 Subjective:  Patient ID: Kyle Swanson, male    DOB: 1965/07/19  Age: 58 y.o. MRN: [id number]  CC:  Chief Complaint  Patient presents with   Medical Management of Chronic Issues    Pt doing well notes     HPI Kyle Swanson presents for follow up.   Bilateral elbow pain: Followed by Dr. Althea Charon for tendonopathy issues. Tried PT, dry needling, prior cortisone Now on nitroglycerin patch. No side effects.    Hyperlipidemia: Treated with Lipitor 10 mg daily.  Denies any myalgias/side effects. On CoQ10 on occasion.  Lab Results  Component Value Date   CHOL 154 11/22/2022   HDL 60.80 11/22/2022   LDLCALC 78 11/22/2022   TRIG 72.0 11/22/2022   CHOLHDL 3 11/22/2022   Lab Results  Component Value Date   ALT 20 11/22/2022   AST 17 11/22/2022   ALKPHOS 64 11/22/2022   BILITOT 0.9 11/22/2022   Nonalcoholic fatty liver disease with elevated LFTs Has been evaluated by gastroenterology.  Elevated ferritin with secondary iron overload no sign of hemochromatosis.  Ongoing monitoring of LFTs.  Weight had improved previously.  Denies abdominal pain, nausea, vomiting, jaundice.  Few alcoholic drinks per week.  LFT readings were normal in August.  Weight has increased some since that time. Opportunity for more walking. Plans to increase.  Wt Readings from Last 3 Encounters:  05/27/23 220 lb 9.6 oz (100.1 kg)  11/22/22 200 lb 9.6 oz (91 kg)  10/30/22 201 lb (91.2 kg)    Allergic rhinitis Treated with Flonase, Claritin as needed OTC.   BPH with LUTS Followed by urology.  Treated with Flomax.Urology visit in June 2024, Dr. Alvester Morin.  1 year follow-up with PSA.urinating ok. Better past few months.   Health maintenance:  COVID booster: declines.  Pneumococcal vaccine: discussed today. Agrees to vaccine.   History Patient Active Problem List   Diagnosis Date Noted   Hyperlipidemia 05/24/2022   NAFLD (nonalcoholic fatty liver disease) 46/96/2952   General weakness 08/09/2016   Viral  illness 08/09/2016   Palpitations 01/05/2013   Allergy    Asthma    Allergic rhinitis 04/28/2012   Routine health maintenance 09/06/2011   Past Medical History:  Diagnosis Date   Allergic rhinitis 04/28/2012   Allergy    Anxiety    Asthma    as a child - no meds or inhalers now   Closed fracture of right distal radius    Hyperlipidemia    Past Surgical History:  Procedure Laterality Date   GANGLION CYST EXCISION     left wrist 10 years ago   OPEN REDUCTION INTERNAL FIXATION (ORIF) DISTAL RADIAL FRACTURE Right 02/11/2019   Procedure: OPEN REDUCTION INTERNAL FIXATION (ORIF) DISTAL RADIAL FRACTURE;  Surgeon: Mack Hook, MD;  Location: [facility];  Service: Orthopedics;  Laterality: Right;  SURGERY REQUEST TIME: 90 MINUTES PRE OP REGIONAL   TONSILLECTOMY AND ADENOIDECTOMY     childhood   Allergies  Allergen Reactions   Sulfa Antibiotics Rash   Prior to Admission medications   Medication Sig Start Date End Date Taking? Authorizing Provider  Multiple Vitamin (MULTIVITAMIN) tablet Take 1 tablet by mouth daily.   Yes [provider]  atorvastatin (LIPITOR) 10 MG tablet TAKE 1 TABLET BY MOUTH EVERY DAY. 11/22/22  Yes Shade Flood, MD  B Complex-C (SUPER B COMPLEX PO)  12/07/20  Yes [provider]  Cholecalciferol (VITAMIN D3) 10 MCG (400 UNIT) CAPS  12/07/20   [provider]  doxycycline (MONODOX)  100 MG capsule Take 100 mg by mouth daily. Patient not taking: Reported on 05/27/2023 06/18/21   [provider]  fluticasone (FLONASE) 50 MCG/ACT nasal spray Place into both nostrils daily. Patient not taking: Reported on 05/27/2023    [provider]  loratadine (CLARITIN) 10 MG tablet Take 10 mg by mouth daily. Patient not taking: Reported on 05/27/2023    [provider]  tamsulosin (FLOMAX) 0.4 MG CAPS capsule Take 1 capsule by mouth daily. Patient not taking: Reported on 05/27/2023 07/27/19   [provider]    Social History   Socioeconomic History   Marital status: Divorced    Spouse name: Not on file   Number of children: Not on file   Years of education: Not on file   Highest education level: Bachelor's degree (e.g., BA, AB, BS)  Occupational History   Occupation: Tour manager  Tobacco Use   Smoking status: Never   Smokeless tobacco: Never  Vaping Use   Vaping status: Never Used  Substance and Sexual Activity   Alcohol use: Yes    Alcohol/week: 3.0 - 5.0 standard drinks of alcohol    Types: 3 - 5 Cans of beer per week    Comment: BEER   Drug use: No   Sexual activity: Yes    Comment: number of sex partners in the last 12 months - 4  Other Topics Concern   Not on file  Social History Narrative   Divorced. Education: [employer]. Exercise: No.   Social Drivers of Health   Financial Resource Strain: Low Risk  (05/26/2023)   Overall Financial Resource Strain (CARDIA)    Difficulty of Paying Living Expenses: Not very hard  Food Insecurity: No Food Insecurity (05/26/2023)   Hunger Vital Sign    Worried About Running Out of Food in the Last Year: Never true    Ran Out of Food in the Last Year: Never true  Transportation Needs: No Transportation Needs (05/26/2023)   PRAPARE - Administrator, Civil Service (Medical): No    Lack of Transportation (Non-Medical): No  Physical Activity: Unknown (05/26/2023)   Exercise Vital Sign    Days of Exercise per Week: 0 days    Minutes of Exercise per Session: Not on file  Stress: No Stress Concern Present (05/26/2023)   [employer] of Occupational Health - Occupational Stress Questionnaire    Feeling of Stress : Not at all  Social Connections: Socially Isolated (05/26/2023)   Social Connection and Isolation Panel [NHANES]    Frequency of Communication with Friends and Family: Twice a week    Frequency of Social Gatherings with Friends and Family: Twice a week    Attends Religious Services: Never    Database administrator or  Organizations: No    Attends Engineer, structural: Not on file    Marital Status: Divorced  Intimate Partner Violence: Not on file    Review of Systems  Constitutional:  Negative for fatigue and unexpected weight change.  Eyes:  Negative for visual disturbance.  Respiratory:  Negative for cough, chest tightness and shortness of breath.   Cardiovascular:  Negative for chest pain, palpitations and leg swelling.  Gastrointestinal:  Negative for abdominal pain and blood in stool.  Neurological:  Negative for dizziness, light-headedness and headaches.     Objective:   Vitals:   05/27/23 0817  BP: 128/80  Pulse: 73  Temp: 98 F (36.7 C)  TempSrc: Temporal  SpO2: 98%  Weight: 220 lb  9.6 oz (100.1 kg)  Height: 6\' 1"  (1.854 m)     Physical Exam Vitals reviewed.  Constitutional:      Appearance: He is well-developed.  HENT:     Head: Normocephalic and atraumatic.  Neck:     Vascular: No carotid bruit or JVD.  Cardiovascular:     Rate and Rhythm: Normal rate and regular rhythm.     Heart sounds: Normal heart sounds. No murmur heard. Pulmonary:     Effort: Pulmonary effort is normal.     Breath sounds: Normal breath sounds. No rales.  Musculoskeletal:     Right lower leg: No edema.     Left lower leg: No edema.  Skin:    General: Skin is warm and dry.  Neurological:     Mental Status: He is alert and oriented to person, place, and time.  Psychiatric:        Mood and Affect: Mood normal.        Assessment & Plan:  Clance Baquero is a 58 y.o. male . Hyperlipidemia, unspecified hyperlipidemia type - Plan: Comprehensive metabolic panel, Lipid panel, atorvastatin (LIPITOR) 10 MG tablet  -  Stable, tolerating current regimen. Medications refilled. Labs pending as above.   NAFLD (nonalcoholic fatty liver disease) - Plan: Comprehensive metabolic panel  -Check LFTs.  Weight has increased since last visit.  Does note there may be room for improvement with exercise  and we will continue to monitor for now, check LFTs with RTC precautions.  Asymptomatic  Benign prostatic hyperplasia, unspecified whether lower urinary tract symptoms present  -Improving.  Continue tamsulosin.  Tolerating current dose.  Continue follow-up with urology as planned.  Allergic rhinitis, unspecified seasonality, unspecified trigger  -Stable with over-the-counter treatment.  Need for pneumococcal vaccination - Plan: Pneumococcal conjugate vaccine 20-valent -recommendations discussed and he did request pneumonia vaccine today.  Prevnar 20 given.  Bilateral elbow pain New concern.  Followed by orthopedics, now on topical nitroglycerin patch.  Continue follow-up with Ortho.    Meds ordered this encounter  Medications   atorvastatin (LIPITOR) 10 MG tablet    Sig: TAKE 1 TABLET BY MOUTH EVERY DAY.    Dispense:  90 tablet    Refill:  1   There are no Patient Instructions on file for this visit.    Signed,   Meredith Staggers, MD [facility], [facility] 05/27/23 8:48 AM

## 2023-05-30 ENCOUNTER — Encounter: Payer: Self-pay | Admitting: Family Medicine

## 2023-12-05 ENCOUNTER — Encounter: Payer: Self-pay | Admitting: Family Medicine

## 2023-12-05 ENCOUNTER — Ambulatory Visit: Admitting: Family Medicine

## 2023-12-05 VITALS — BP 120/86 | HR 85 | Temp 98.0°F | Resp 19 | Ht 73.0 in | Wt 214.0 lb

## 2023-12-05 DIAGNOSIS — E785 Hyperlipidemia, unspecified: Secondary | ICD-10-CM | POA: Diagnosis not present

## 2023-12-05 DIAGNOSIS — Z0000 Encounter for general adult medical examination without abnormal findings: Secondary | ICD-10-CM | POA: Diagnosis not present

## 2023-12-05 DIAGNOSIS — Z23 Encounter for immunization: Secondary | ICD-10-CM | POA: Diagnosis not present

## 2023-12-05 DIAGNOSIS — Z130 Encounter for screening for diseases of the blood and blood-forming organs and certain disorders involving the immune mechanism: Secondary | ICD-10-CM | POA: Diagnosis not present

## 2023-12-05 DIAGNOSIS — J309 Allergic rhinitis, unspecified: Secondary | ICD-10-CM

## 2023-12-05 DIAGNOSIS — Z131 Encounter for screening for diabetes mellitus: Secondary | ICD-10-CM | POA: Diagnosis not present

## 2023-12-05 DIAGNOSIS — K760 Fatty (change of) liver, not elsewhere classified: Secondary | ICD-10-CM

## 2023-12-05 DIAGNOSIS — M7710 Lateral epicondylitis, unspecified elbow: Secondary | ICD-10-CM | POA: Insufficient documentation

## 2023-12-05 DIAGNOSIS — N400 Enlarged prostate without lower urinary tract symptoms: Secondary | ICD-10-CM

## 2023-12-05 LAB — LIPID PANEL
Cholesterol: 175 mg/dL (ref 0–200)
HDL: 53.5 mg/dL (ref >39.00)
LDL Cholesterol: 103 mg/dL — ABNORMAL HIGH (ref 0–99)
NonHDL: 121.16
Total CHOL/HDL Ratio: 3
Triglycerides: 92 mg/dL (ref 0.0–149.0)
VLDL: 18.4 mg/dL (ref 0.0–40.0)

## 2023-12-05 LAB — COMPREHENSIVE METABOLIC PANEL WITH GFR
ALT: 31 U/L (ref 0–53)
AST: 24 U/L (ref 0–37)
Albumin: 4.6 g/dL (ref 3.5–5.2)
Alkaline Phosphatase: 65 U/L (ref 39–117)
BUN: 22 mg/dL (ref 6–23)
CO2: 27 meq/L (ref 19–32)
Calcium: 9.4 mg/dL (ref 8.4–10.5)
Chloride: 103 meq/L (ref 96–112)
Creatinine, Ser: 1.07 mg/dL (ref 0.40–1.50)
GFR: 77.44 mL/min (ref >60.00)
Glucose, Bld: 90 mg/dL (ref 70–99)
Potassium: 3.9 meq/L (ref 3.5–5.1)
Sodium: 137 meq/L (ref 135–145)
Total Bilirubin: 0.9 mg/dL (ref 0.2–1.2)
Total Protein: 7.3 g/dL (ref 6.0–8.3)

## 2023-12-05 LAB — CBC
HCT: 46 % (ref 39.0–52.0)
Hemoglobin: 15.7 g/dL (ref 13.0–17.0)
MCHC: 34.2 g/dL (ref 30.0–36.0)
MCV: 89.9 fl (ref 78.0–100.0)
Platelets: 213 K/uL (ref 150.0–400.0)
RBC: 5.11 Mil/uL (ref 4.22–5.81)
RDW: 13.1 % (ref 11.5–15.5)
WBC: 5.5 K/uL (ref 4.0–10.5)

## 2023-12-05 LAB — HEMOGLOBIN A1C: Hgb A1c MFr Bld: 5.6 % (ref 4.6–6.5)

## 2023-12-05 MED ORDER — ATORVASTATIN CALCIUM 10 MG PO TABS: ORAL_TABLET | ORAL | 1 refills | Status: AC

## 2023-12-05 NOTE — Addendum Note (Signed)
 Addended by: Gerilynn Mccullars R on: 12/05/2023 08:47 AM   Modules accepted: Orders

## 2023-12-05 NOTE — Progress Notes (Signed)
 Subjective:  Patient ID: Kyle Swanson, male    DOB: 01/02/1966  Age: 58 y.o. MRN: [id number]  CC:  Chief Complaint  Patient presents with   Annual Exam    No questions or concerns.    HPI Kyle Swanson presents for Annual Exam PCP: [facility], Dr. Rolan Molt, Routine follow-up - appt this am for routine screening.  Urology, office visit 10/03/2023, Dr. Carolee.  BPH with LUTS, nocturia, PSA screening through urology.  PSA in July - 0.9.  1 year follow-up.  Continued Flomax -working for most part.  Gastroenterology, Dr. Legrand, nonalcoholic fatty liver disease, with prior abnormal LFTs.  Colonoscopy 10/30/2022.  Few  polyps removed.  Repeat 5 years.  Prior elevated ferritin with secondary iron overload but no sign of hemochromatosis.  Routine monitoring of LFTs planned. 3-5 alcohol drinks per week or less.  Lab Results  Component Value Date   ALT 31 05/27/2023   AST 22 05/27/2023   ALKPHOS 56 05/27/2023   BILITOT 0.8 05/27/2023   Allergic rhinitis Treated with as needed Flonase, Claritin - taking daily. Working well.   BPH with LUTS Followed by urology, treated with Flomax as above.   Hyperlipidemia: Treated with Lipitor 10 mg daily without new myalgias/side effects. Fasting today.  Lab Results  Component Value Date   CHOL 187 05/27/2023   HDL 60.50 05/27/2023   LDLCALC 106 (H) 05/27/2023   TRIG 102.0 05/27/2023   CHOLHDL 3 05/27/2023   Lab Results  Component Value Date   ALT 31 05/27/2023   AST 22 05/27/2023   ALKPHOS 56 05/27/2023   BILITOT 0.8 05/27/2023         12/05/2023    8:11 AM 05/27/2023    8:21 AM 11/22/2022    8:11 AM 05/24/2022    8:02 AM 11/23/2021    8:06 AM  Depression screen PHQ 2/9  Decreased Interest 0 0 0 0 0  Down, Depressed, Hopeless 0 0 0 0 0  PHQ - 2 Score 0 0 0 0 0  Altered sleeping 1 0 0 0   Tired, decreased energy 0 1 0 0   Change in appetite 0 0 0 0   Feeling bad or failure about yourself  0 0 0 0   Trouble concentrating 0 0 0 0    Moving slowly or fidgety/restless 0 0 0 0   Suicidal thoughts 0 0 0 0   PHQ-9 Score 1 1 0 0   Difficult doing work/chores Not difficult at all Not difficult at all       Health Maintenance  Topic Date Due   Hepatitis B Vaccines 19-59 Average Risk (1 of 3 - 19+ 3-dose series) Never done   COVID-19 Vaccine (5 - 2025-26 season) 06/03/2024 (Originally 11/25/2023)   DTaP/Tdap/Td (3 - Td or Tdap) 06/22/2024   Colonoscopy  10/30/2027   Pneumococcal Vaccine: 50+ Years  Completed   Influenza Vaccine  Completed   Hepatitis C Screening  Completed   HIV Screening  Completed   Zoster Vaccines- Shingrix  Completed   HPV VACCINES  Aged Out   Meningococcal B Vaccine  Aged Out  Colonoscopy utd as above.  Prostate: with urology as above.  Lab Results  Component Value Date   PSA1 0.7 01/02/2019   PSA1 0.8 08/26/2018   PSA1 1.1 06/27/2017   PSA 0.83 01/31/2015   PSA 1.16 10/06/2012   PSA 1.11 04/28/2012      Immunization History  Administered Date(s) Administered   Influenza, Seasonal,  Injecte, Preservative Fre 04/28/2012, 11/22/2022, 12/05/2023   Influenza,inj,Quad PF,6+ Mos 01/04/2017, 12/07/2017, 12/16/2018, 11/23/2021   Influenza,inj,quad, With Preservative 02/18/2016   Influenza-Unspecified 12/25/2014, 01/19/2016, 01/04/2017, 12/07/2017, 12/02/2020   Moderna Sars-Covid-2 Vaccination 06/04/2019, 07/02/2019, 02/22/2020, 01/09/2021   PNEUMOCOCCAL CONJUGATE-20 05/27/2023   Td 06/23/2014   Tdap 04/28/2012   Zoster Recombinant(Shingrix) 10/11/2017, 12/07/2017  Flu vaccine today.   No results found.optho yearly - minimal change on last visit with [facility] in June.   Dental: every 6 months - June.   Alcohol: as above - 3-5 per week or less.   Tobacco: none.   Exercise: slight decrease recently - had elbow issues.  Surgery R elbow in May - Dr. Dozier. Walking/cardio for now. 2 days per week.    History Patient Active Problem List   Diagnosis Date Noted   Tennis  elbow 12/05/2023   Hyperlipidemia 05/24/2022   NAFLD (nonalcoholic fatty liver disease) 97/70/7975   General weakness 08/09/2016   Viral illness 08/09/2016   Palpitations 01/05/2013   Allergy    Asthma    Allergic rhinitis 04/28/2012   Routine health maintenance 09/06/2011   Past Medical History:  Diagnosis Date   Allergic rhinitis 04/28/2012   Allergy    Anxiety    Arthritis 2020   Asthma    as a child - no meds or inhalers now   Cataract 2024   just starting per eye dr   Closed fracture of right distal radius    Hyperlipidemia    Past Surgical History:  Procedure Laterality Date   FRACTURE SURGERY  01/2019   Wrist   GANGLION CYST EXCISION     left wrist 10 years ago   OPEN REDUCTION INTERNAL FIXATION (ORIF) DISTAL RADIAL FRACTURE Right 02/11/2019   Procedure: OPEN REDUCTION INTERNAL FIXATION (ORIF) DISTAL RADIAL FRACTURE;  Surgeon: Sebastian Lenis, MD;  Location: [facility];  Service: Orthopedics;  Laterality: Right;  SURGERY REQUEST TIME: 90 MINUTES PRE OP REGIONAL   TONSILLECTOMY AND ADENOIDECTOMY     childhood   Allergies  Allergen Reactions   Sulfa Antibiotics Rash   Prior to Admission medications   Medication Sig Start Date End Date Taking? Authorizing Provider  atorvastatin  (LIPITOR) 10 MG tablet TAKE 1 TABLET BY MOUTH EVERY DAY. 05/27/23  Yes Levora Reyes SAUNDERS, MD  B Complex-C (SUPER B COMPLEX PO)  12/07/20  Yes [provider]  Cholecalciferol (VITAMIN D3) 10 MCG (400 UNIT) CAPS  12/07/20  Yes [provider]  doxycycline (MONODOX) 100 MG capsule Take 100 mg by mouth daily. 06/18/21  Yes [provider]  fluticasone (FLONASE) 50 MCG/ACT nasal spray Place into both nostrils daily.   Yes [provider]  loratadine (CLARITIN) 10 MG tablet Take 10 mg by mouth daily.   Yes [provider]  tamsulosin (FLOMAX) 0.4 MG CAPS capsule Take 1 capsule by mouth daily. 07/27/19  Yes [provider]  Multiple  Vitamin (MULTIVITAMIN) tablet Take 1 tablet by mouth daily.    [provider]  nitroGLYCERIN (NITRODUR - DOSED IN MG/24 HR) 0.2 mg/hr patch Place 0.2 mg onto the skin daily. 05/17/23   [provider]   Social History   Socioeconomic History   Marital status: Divorced    Spouse name: Not on file   Number of children: Not on file   Years of education: Not on file   Highest education level: Bachelor's degree (e.g., BA, AB, BS)  Occupational History   Occupation: Tour manager  Tobacco Use  Smoking status: Never   Smokeless tobacco: Never   Tobacco comments:    NA  Vaping Use   Vaping status: Never Used  Substance and Sexual Activity   Alcohol use: Yes    Alcohol/week: 3.0 - 5.0 standard drinks of alcohol    Comment: BEER   Drug use: No   Sexual activity: Not Currently    Birth control/protection: Abstinence, Condom    Comment: number of sex partners in the last 12 months - 4  Other Topics Concern   Not on file  Social History Narrative   Divorced. Education: [employer]. Exercise: No.   Social Drivers of Health   Financial Resource Strain: Low Risk  (12/02/2023)   Overall Financial Resource Strain (CARDIA)    Difficulty of Paying Living Expenses: Not hard at all  Food Insecurity: No Food Insecurity (12/02/2023)   Hunger Vital Sign    Worried About Running Out of Food in the Last Year: Never true    Ran Out of Food in the Last Year: Never true  Transportation Needs: No Transportation Needs (12/02/2023)   PRAPARE - Administrator, Civil Service (Medical): No    Lack of Transportation (Non-Medical): No  Physical Activity: Insufficiently Active (12/02/2023)   Exercise Vital Sign    Days of Exercise per Week: 2 days    Minutes of Exercise per Session: 40 min  Stress: No Stress Concern Present (12/02/2023)   [employer] of Occupational Health - Occupational Stress Questionnaire    Feeling of Stress: Only a little  Social Connections: Socially  Isolated (12/02/2023)   Social Connection and Isolation Panel    Frequency of Communication with Friends and Family: Twice a week    Frequency of Social Gatherings with Friends and Family: Twice a week    Attends Religious Services: Never    Database administrator or Organizations: No    Attends Engineer, structural: Not on file    Marital Status: Divorced  Intimate Partner Violence: Not on file    Review of Systems 13 point review of systems per patient health survey noted.  Negative other than as indicated above or in HPI.    Objective:   Vitals:   12/05/23 0808  BP: 120/86  Pulse: 85  Resp: 19  Temp: 98 F (36.7 C)  TempSrc: Temporal  SpO2: 96%  Weight: 214 lb (97.1 kg)  Height: 6' 1 (1.854 m)     Physical Exam Vitals reviewed.  Constitutional:      Appearance: He is well-developed.  HENT:     Head: Normocephalic and atraumatic.     Right Ear: External ear normal.     Left Ear: External ear normal.  Eyes:     Conjunctiva/sclera: Conjunctivae normal.     Pupils: Pupils are equal, round, and reactive to light.  Neck:     Thyroid: No thyromegaly.  Cardiovascular:     Rate and Rhythm: Normal rate and regular rhythm.     Heart sounds: Normal heart sounds.  Pulmonary:     Effort: Pulmonary effort is normal. No respiratory distress.     Breath sounds: Normal breath sounds. No wheezing.  Abdominal:     General: There is no distension.     Palpations: Abdomen is soft.     Tenderness: There is no abdominal tenderness.  Musculoskeletal:        General: No tenderness. Normal range of motion.     Cervical back: Normal range of motion and neck supple.  Lymphadenopathy:     Cervical: No cervical adenopathy.  Skin:    General: Skin is warm and dry.  Neurological:     Mental Status: He is alert and oriented to person, place, and time.     Deep Tendon Reflexes: Reflexes are normal and symmetric.  Psychiatric:        Behavior: Behavior normal.         Assessment & Plan:  Kyle Swanson is a 58 y.o. male . Annual physical exam  - -anticipatory guidance as below in AVS, screening labs above. Health maintenance items as above in HPI discussed/recommended as applicable.   Need for influenza vaccination - Plan: Flu vaccine trivalent PF, 6mos and older(Flulaval,Afluria,Fluarix,Fluzone)  NAFLD (nonalcoholic fatty liver disease) - Plan: Comprehensive metabolic panel with GFR  - Most recent LFTs stable, check updated labs and adjust plan accordingly.  Hyperlipidemia, unspecified hyperlipidemia type - Plan: Comprehensive metabolic panel with GFR, Lipid panel  - Tolerating current regimen, check labs, adjust plan accordingly.  Would like to see LDL slightly lower.  May have some room for diet/exercise improvements as well.  Benign prostatic hyperplasia, unspecified whether lower urinary tract symptoms present  - Stable with Flomax, continue follow-up with urology.  Allergic rhinitis, unspecified seasonality, unspecified trigger  - Stable Claritin, Flonase over-the-counter.  Screening for diabetes mellitus - Plan: Hemoglobin A1c  - Borderline A1c last year.  Some decreased exercise understandable with elbow surgery/restrictions and exercise.  Continue low intensity exercise, cardio, watch diet, labs as above and adjust plan accordingly.  Screening, anemia, deficiency, iron - Plan: CBC   No orders of the defined types were placed in this encounter.  Patient Instructions  Thank you for coming in today. No change in medications at this time. If there are any concerns on your bloodwork, I will let you know. Take care!  Preventive Care 43-86 Years Old, Male Preventive care refers to lifestyle choices and visits with your health care provider that can promote health and wellness. Preventive care visits are also called wellness exams. What can I expect for my preventive care visit? Counseling During your preventive care visit, your  health care provider may ask about your: Medical history, including: Past medical problems. Family medical history. Current health, including: Emotional well-being. Home life and relationship well-being. Sexual activity. Lifestyle, including: Alcohol, nicotine or tobacco, and drug use. Access to firearms. Diet, exercise, and sleep habits. Safety issues such as seatbelt and bike helmet use. Sunscreen use. Work and work Astronomer. Physical exam Your health care provider will check your: Height and weight. These may be used to calculate your BMI (body mass index). BMI is a measurement that tells if you are at a healthy weight. Waist circumference. This measures the distance around your waistline. This measurement also tells if you are at a healthy weight and may help predict your risk of certain diseases, such as type 2 diabetes and high blood pressure. Heart rate and blood pressure. Body temperature. Skin for abnormal spots. What immunizations do I need?  Vaccines are usually given at various ages, according to a schedule. Your health care provider will recommend vaccines for you based on your age, medical history, and lifestyle or other factors, such as travel or where you work. What tests do I need? Screening Your health care provider may recommend screening tests for certain conditions. This may include: Lipid and cholesterol levels. Diabetes screening. This is done by checking your blood sugar (glucose) after you have not eaten for a while (fasting). Hepatitis  B test. Hepatitis C test. HIV (human immunodeficiency virus) test. STI (sexually transmitted infection) testing, if you are at risk. Lung cancer screening. Prostate cancer screening. Colorectal cancer screening. Talk with your health care provider about your test results, treatment options, and if necessary, the need for more tests. Follow these instructions at home: Eating and drinking  Eat a diet that includes  fresh fruits and vegetables, whole grains, lean protein, and low-fat dairy products. Take vitamin and mineral supplements as recommended by your health care provider. Do not drink alcohol if your health care provider tells you not to drink. If you drink alcohol: Limit how much you have to 0-2 drinks a day. Know how much alcohol is in your drink. In the [place] one drink equals one 12 oz bottle of beer (355 mL), one 5 oz glass of wine (148 mL), or one 1 oz glass of hard liquor (44 mL). Lifestyle Brush your teeth every morning and night with fluoride toothpaste. Floss one time each day. Exercise for at least 30 minutes 5 or more days each week. Do not use any products that contain nicotine or tobacco. These products include cigarettes, chewing tobacco, and vaping devices, such as e-cigarettes. If you need help quitting, ask your health care provider. Do not use drugs. If you are sexually active, practice safe sex. Use a condom or other form of protection to prevent STIs. Take aspirin only as told by your health care provider. Make sure that you understand how much to take and what form to take. Work with your health care provider to find out whether it is safe and beneficial for you to take aspirin daily. Find healthy ways to manage stress, such as: Meditation, yoga, or listening to music. Journaling. Talking to a trusted person. Spending time with friends and family. Minimize exposure to UV radiation to reduce your risk of skin cancer. Safety Always wear your seat belt while driving or riding in a vehicle. Do not drive: If you have been drinking alcohol. Do not ride with someone who has been drinking. When you are tired or distracted. While texting. If you have been using any mind-altering substances or drugs. Wear a helmet and other protective equipment during sports activities. If you have firearms in your house, make sure you follow all gun safety procedures. What's next? Go to your  health care provider once a year for an annual wellness visit. Ask your health care provider how often you should have your eyes and teeth checked. Stay up to date on all vaccines. This information is not intended to replace advice given to you by your health care provider. Make sure you discuss any questions you have with your health care provider. Document Revised: 09/07/2020 Document Reviewed: 09/07/2020 Elsevier Patient Education  2024 Elsevier Inc.     Signed,   Reyes Pines, MD [facility], [facility] 12/05/23 8:39 AM

## 2023-12-05 NOTE — Patient Instructions (Addendum)
 Thank you for coming in today. No change in medications at this time. If there are any concerns on your bloodwork, I will let you know. Take care! Preventive Care 41-57 Years Old, Male Preventive care refers to lifestyle choices and visits with your health care provider that can promote health and wellness. Preventive care visits are also called wellness exams. What can I expect for my preventive care visit? Counseling During your preventive care visit, your health care provider may ask about your: Medical history, including: Past medical problems. Family medical history. Current health, including: Emotional well-being. Home life and relationship well-being. Sexual activity. Lifestyle, including: Alcohol, nicotine or tobacco, and drug use. Access to firearms. Diet, exercise, and sleep habits. Safety issues such as seatbelt and bike helmet use. Sunscreen use. Work and work Astronomer. Physical exam Your health care provider will check your: Height and weight. These may be used to calculate your BMI (body mass index). BMI is a measurement that tells if you are at a healthy weight. Waist circumference. This measures the distance around your waistline. This measurement also tells if you are at a healthy weight and may help predict your risk of certain diseases, such as type 2 diabetes and high blood pressure. Heart rate and blood pressure. Body temperature. Skin for abnormal spots. What immunizations do I need?  Vaccines are usually given at various ages, according to a schedule. Your health care provider will recommend vaccines for you based on your age, medical history, and lifestyle or other factors, such as travel or where you work. What tests do I need? Screening Your health care provider may recommend screening tests for certain conditions. This may include: Lipid and cholesterol levels. Diabetes screening. This is done by checking your blood sugar (glucose) after you have not  eaten for a while (fasting). Hepatitis B test. Hepatitis C test. HIV (human immunodeficiency virus) test. STI (sexually transmitted infection) testing, if you are at risk. Lung cancer screening. Prostate cancer screening. Colorectal cancer screening. Talk with your health care provider about your test results, treatment options, and if necessary, the need for more tests. Follow these instructions at home: Eating and drinking  Eat a diet that includes fresh fruits and vegetables, whole grains, lean protein, and low-fat dairy products. Take vitamin and mineral supplements as recommended by your health care provider. Do not drink alcohol if your health care provider tells you not to drink. If you drink alcohol: Limit how much you have to 0-2 drinks a day. Know how much alcohol is in your drink. In the [place] one drink equals one 12 oz bottle of beer (355 mL), one 5 oz glass of wine (148 mL), or one 1 oz glass of hard liquor (44 mL). Lifestyle Brush your teeth every morning and night with fluoride toothpaste. Floss one time each day. Exercise for at least 30 minutes 5 or more days each week. Do not use any products that contain nicotine or tobacco. These products include cigarettes, chewing tobacco, and vaping devices, such as e-cigarettes. If you need help quitting, ask your health care provider. Do not use drugs. If you are sexually active, practice safe sex. Use a condom or other form of protection to prevent STIs. Take aspirin  only as told by your health care provider. Make sure that you understand how much to take and what form to take. Work with your health care provider to find out whether it is safe and beneficial for you to take aspirin  daily. Find healthy ways to manage  stress, such as: Meditation, yoga, or listening to music. Journaling. Talking to a trusted person. Spending time with friends and family. Minimize exposure to UV radiation to reduce your risk of skin  cancer. Safety Always wear your seat belt while driving or riding in a vehicle. Do not drive: If you have been drinking alcohol. Do not ride with someone who has been drinking. When you are tired or distracted. While texting. If you have been using any mind-altering substances or drugs. Wear a helmet and other protective equipment during sports activities. If you have firearms in your house, make sure you follow all gun safety procedures. What's next? Go to your health care provider once a year for an annual wellness visit. Ask your health care provider how often you should have your eyes and teeth checked. Stay up to date on all vaccines. This information is not intended to replace advice given to you by your health care provider. Make sure you discuss any questions you have with your health care provider. Document Revised: 09/07/2020 Document Reviewed: 09/07/2020 Elsevier Patient Education  2024 [employer].

## 2023-12-10 ENCOUNTER — Ambulatory Visit: Payer: Self-pay | Admitting: Family Medicine

## 2024-06-10 ENCOUNTER — Ambulatory Visit: Admitting: Family Medicine

## 2024-06-12 ENCOUNTER — Ambulatory Visit: Admitting: Family Medicine
